# Patient Record
Sex: Male | Born: 1955 | Race: White | Hispanic: No | Marital: Married | Smoking: Never smoker
Health system: Southern US, Community
[De-identification: ages and names within clinical notes are randomized; demographics above are authoritative.]

## PROBLEM LIST (undated history)

## (undated) DIAGNOSIS — G510 Bell's palsy: Secondary | ICD-10-CM

## (undated) DIAGNOSIS — G4733 Obstructive sleep apnea (adult) (pediatric): Secondary | ICD-10-CM

## (undated) DIAGNOSIS — G4710 Hypersomnia, unspecified: Secondary | ICD-10-CM

## (undated) DIAGNOSIS — E119 Type 2 diabetes mellitus without complications: Secondary | ICD-10-CM

## (undated) DIAGNOSIS — E7800 Pure hypercholesterolemia, unspecified: Secondary | ICD-10-CM

## (undated) HISTORY — DX: Bell's palsy: G51.0

## (undated) HISTORY — DX: Pure hypercholesterolemia, unspecified: E78.00

## (undated) HISTORY — DX: Type 2 diabetes mellitus without complications: E11.9

## (undated) HISTORY — DX: Obstructive sleep apnea (adult) (pediatric): G47.33

## (undated) HISTORY — DX: Hypersomnia, unspecified: G47.10

---

## 2000-02-08 ENCOUNTER — Ambulatory Visit: Admission: RE | Admit: 2000-02-08 | Discharge: 2000-02-08 | Payer: Self-pay | Admitting: Otolaryngology

## 2008-03-09 ENCOUNTER — Encounter: Admission: RE | Admit: 2008-03-09 | Discharge: 2008-03-09 | Payer: Self-pay

## 2008-08-20 ENCOUNTER — Ambulatory Visit: Payer: Self-pay | Admitting: Oncology

## 2008-09-03 LAB — COMPREHENSIVE METABOLIC PANEL WITH GFR
ALT: 67 U/L — ABNORMAL HIGH (ref 0–53)
AST: 52 U/L — ABNORMAL HIGH (ref 0–37)
Albumin: 4.1 g/dL (ref 3.5–5.2)
Alkaline Phosphatase: 68 U/L (ref 39–117)
BUN: 12 mg/dL (ref 6–23)
CO2: 27 meq/L (ref 19–32)
Calcium: 9 mg/dL (ref 8.4–10.5)
Chloride: 103 meq/L (ref 96–112)
Creatinine, Ser: 0.85 mg/dL (ref 0.40–1.50)
Glucose, Bld: 149 mg/dL — ABNORMAL HIGH (ref 70–99)
Potassium: 4.2 meq/L (ref 3.5–5.3)
Sodium: 138 meq/L (ref 135–145)
Total Bilirubin: 0.6 mg/dL (ref 0.3–1.2)
Total Protein: 7.3 g/dL (ref 6.0–8.3)

## 2008-09-03 LAB — PROTHROMBIN TIME
INR: 1 (ref 0.0–1.5)
Prothrombin Time: 13.5 s (ref 11.6–15.2)

## 2008-09-03 LAB — CBC WITH DIFFERENTIAL/PLATELET
BASO%: 0.2 % (ref 0.0–2.0)
EOS%: 4.2 % (ref 0.0–7.0)
HCT: 38 % — ABNORMAL LOW (ref 38.7–49.9)
MCH: 29.8 pg (ref 28.0–33.4)
MCHC: 35.3 g/dL (ref 32.0–35.9)
MONO#: 0.5 10*3/uL (ref 0.1–0.9)
NEUT%: 62.7 % (ref 40.0–75.0)
RBC: 4.51 10*6/uL (ref 4.20–5.71)
WBC: 5.9 10*3/uL (ref 4.0–10.0)
lymph#: 1.4 10*3/uL (ref 0.9–3.3)

## 2008-09-03 LAB — LACTATE DEHYDROGENASE: LDH: 138 U/L (ref 94–250)

## 2008-09-03 LAB — CHCC SMEAR

## 2008-09-20 ENCOUNTER — Encounter: Admission: RE | Admit: 2008-09-20 | Discharge: 2008-12-19 | Payer: Self-pay | Admitting: Family Medicine

## 2008-12-31 ENCOUNTER — Ambulatory Visit: Payer: Self-pay | Admitting: Oncology

## 2009-02-02 LAB — CBC WITH DIFFERENTIAL/PLATELET
BASO%: 0.6 % (ref 0.0–2.0)
EOS%: 4.4 % (ref 0.0–7.0)
LYMPH%: 24.9 % (ref 14.0–49.0)
MCH: 29.4 pg (ref 27.2–33.4)
MCHC: 35.1 g/dL (ref 32.0–36.0)
MCV: 83.6 fL (ref 79.3–98.0)
MONO%: 8.5 % (ref 0.0–14.0)
NEUT#: 3.3 10*3/uL (ref 1.5–6.5)
Platelets: 110 10*3/uL — ABNORMAL LOW (ref 140–400)
RBC: 4.55 10*6/uL (ref 4.20–5.82)
RDW: 13.4 % (ref 11.0–14.6)

## 2009-02-02 LAB — CHCC SMEAR

## 2009-08-05 ENCOUNTER — Ambulatory Visit: Payer: Self-pay | Admitting: Oncology

## 2014-10-27 ENCOUNTER — Telehealth: Payer: Self-pay | Admitting: Pulmonary Disease

## 2014-10-27 NOTE — Telephone Encounter (Signed)
Called and spoke to pt. Pt has upcoming appt with [facility] on 12/07/14. Pt had questions regarding the payment of the appt, pt does not have insurance coverage. Spoke with Vallarie Mare and was advised a level 2 or 3 appt would cost around $500 with 50% off d/t no insurance. Informed pt. Pt verbalized understanding and denied any further questions or concerns at this time.

## 2014-11-15 ENCOUNTER — Institutional Professional Consult (permissible substitution): Payer: Self-pay | Admitting: Pulmonary Disease

## 2014-12-06 ENCOUNTER — Encounter: Payer: Self-pay | Admitting: Pulmonary Disease

## 2014-12-06 ENCOUNTER — Other Ambulatory Visit: Payer: Self-pay | Admitting: Pulmonary Disease

## 2014-12-07 ENCOUNTER — Institutional Professional Consult (permissible substitution): Payer: Self-pay | Admitting: Pulmonary Disease

## 2018-08-26 ENCOUNTER — Encounter: Payer: Self-pay | Admitting: Hematology and Oncology

## 2018-08-26 ENCOUNTER — Telehealth: Payer: Self-pay | Admitting: Hematology and Oncology

## 2018-08-26 NOTE — Telephone Encounter (Signed)
New referral received from Dr. Daron Offer for pancytopenia. Pt has been scheduled to see Dr. Audelia Hives on 10/18 at [place] from the referring office will notify the pt.

## 2018-09-05 ENCOUNTER — Encounter: Payer: Self-pay | Admitting: Hematology and Oncology

## 2018-09-08 NOTE — Progress Notes (Deleted)
[facility] Initial Consultation  Patient Name:  Carlos Hamilton  DOB: [birth year]   Date of Service: September 10, 2018  Referring Provider: Nickola Major, Md [address] [place], [place] [address]   Consulting Physician: Henreitta Leber, MD Hematology/Oncology  Patient Care Team: No care team member to display  No current outpatient medications on file prior to visit.   No current facility-administered medications on file prior to visit.      Reason for Referral:   Cancer History:  Cancer Staging:  History Present Illness: Carlos Hamilton   Past Medical History: Past Medical History:  Diagnosis Date  . Bell's palsy   . Diabetes   . Hypercholesteremia   . Hypersomnia   . OSA (obstructive sleep apnea)     Surgical History: *** The histories are not reviewed yet. Please review them in the "History" navigator section and refresh this [place].  Gynecologic History:   Family History: Family History  Problem Relation Age of Onset  . Cancer Unknown   . Asthma Unknown   . Heart disease Unknown     Social History: Social History   Socioeconomic History  . Marital status: Married    Spouse name: Not on file  . Number of children: Not on file  . Years of education: Not on file  . Highest education level: Not on file  Occupational History  . Not on file  Social Needs  . Financial resource strain: Not on file  . Food insecurity:    Worry: Not on file    Inability: Not on file  . Transportation needs:    Medical: Not on file    Non-medical: Not on file  Tobacco Use  . Smoking status: Not on file  Substance and Sexual Activity  . Alcohol use: Not on file  . Drug use: Not on file  . Sexual activity: Not on file  Lifestyle  . Physical activity:    Days per week: Not on file    Minutes per session: Not on file  . Stress: Not on file  Relationships  . Social connections:    Talks on phone: Not on file     Gets together: Not on file    Attends religious service: Not on file    Active member of club or organization: Not on file    Attends meetings of clubs or organizations: Not on file    Relationship status: Not on file  . Intimate partner violence:    Fear of current or ex partner: Not on file    Emotionally abused: Not on file    Physically abused: Not on file    Forced sexual activity: Not on file  Other Topics Concern  . Not on file  Social History Narrative  . Not on file    Transfusion History: No prior transfusion  Exposure History:   Allergies:  Allergies not on file  Current Medications: No current outpatient medications on file prior to visit.   No current facility-administered medications on file prior to visit.      Review of Systems: Constitutional: No fever, sweats, or shaking chills.  No appetite or weight deficit. Skin: No rash, scaling, sores, lumps, or jaundice. HEENT: No visual changes or hearing deficit. Pulmonary: No unusual cough, sore throat, or orthopnea. Breasts: No complaints. Cardiovascular: No coronary artery disease, angina, or myocardial infarction.  No cardiac dysrhythmia, essential hypertension, or dyslipidemia. Gastrointestinal: No indigestion, dysphagia, abdominal pain, diarrhea, or constipation.  No change in bowel habits. Genitourinary: No frequency, urgency, hematuria, or dysuria. Musculoskeletal: No arthralgias or myalgias; no joint swelling, pain, or instability. Hematologic: No bleeding tendency or easy bruisability. Endocrine: No intolerance to heat or cold; no thyroid disease or diabetes mellitus. Vascular: No peripheral arterial or venous thromboembolic disease. Psychological: No anxiety, depression, or mood changes; no mental health illnesses. Neurological: No dizziness, lightheadedness, syncope, or near syncopal episodes; no numbness or tingling in the fingers or toes.  Physical Examination: Vital Signs: There is no height  or weight on file to calculate BSA. There were no vitals filed for this visit. There were no vitals filed for this visit. ECOG PERFORMANCE STATUS: {CHL ONC ECOG [id number]} Constitutional:  Carlos Hamilton is fully nourished and developed.  He/She looks age appropriate.  He/She is friendly and cooperative without respiratory compromise at rest. Skin: No rashes, scaling, dryness, jaundice, or itching. HEENT: Head is normocephalic and atraumatic.  Pupils are equal round and reactive to light and accommodation.  Sclerae are anicteric.  Conjunctivae are pink.  No sinus tenderness nor oropharyngeal lesions.  Lips without cracking or peeling; tongue without mass, inflammation, or nodularity.  Mucous membranes are moist. Neck: Supple and symmetric.  No jugular venous distention or thyromegaly.  Trachea is midline. Lymphatics: No cervical or supraclavicular lymphadenopathy.  No epitrochlear, axillary, or inguinal lymphadenopathy is appreciated. Breasts: No mass, discharge, dimpling, or retraction. Respiratory/chest: Thorax is symmetrical.  Breath sounds are clear to auscultation and percussion.  Normal excursion and respiratory effort. Back: Symmetric without deformity or tenderness. Cardiovascular: Heart rate and rhythm are regular without murmurs, gallops, or rubs. Gastrointestinal: Abdomen is soft, nontender; no organomegaly.  Bowel sounds are normoactive.  No masses are appreciated. Genitourinary: Normal external male/male genitalia. Rectal examination: Not performed. Extremities: In the lower extremities, there is no asymmetric swelling, erythema, tenderness, or cord formation.  No clubbing, cyanosis, nor edema. Hematologic: No petechiae, hematomas, or ecchymoses. Psychological:  He/She is oriented to person, place, and time; normal affect, memory, and cognition. Neurological: There are no gross neurologic deficits.  Laboratory Results: I have reviewed the data as listed: CBC Latest Ref Rng & Units  02/02/2009 09/03/2008  WBC 4.0 - 10.3 10e3/uL 5.4 5.9  Hemoglobin 13.0 - 17.1 g/dL 13.4 13.4  Hematocrit 38.4 - 49.9 % 38.0(L) 38.0(L)  Platelets 140 - 400 10e3/uL 110(L) 122(L)    CMP Latest Ref Rng & Units 09/03/2008  Glucose 70 - 99 mg/dL 149(H)  BUN 6 - 23 mg/dL 12  Creatinine 0.40 - 1.50 mg/dL 0.85  Sodium 135 - 145 mEq/L 138  Potassium 3.5 - 5.3 mEq/L 4.2  Chloride 96 - 112 mEq/L 103  CO2 19 - 32 mEq/L 27  Calcium 8.4 - 10.5 mg/dL 9.0  Total Protein 6.0 - 8.3 g/dL 7.3  Total Bilirubin 0.3 - 1.2 mg/dL 0.6  Alkaline Phos 39 - 117 U/L 68  AST 0 - 37 U/L 52(H)  ALT 0 - 53 U/L 67(H)     Diagnostic/Imaging Studies:   Summary/Assessment:   Recommendation/Plan:   The total time spent discussing the XXX, methodology for evaluating XXX,  preliminary considerations with recommendations and plan was 60 minutes.  At least 50% of that time was spent in discussion, reviewing outside records, laboratory evaluation, counseling, and answering questions. All questions were answered to his satisfaction. he knows to call the Clinic with any problems, questions, or concerns.  This note was dictated using voice activated technology/software.  Unfortunately, typographical errors are not uncommon, and transcription is subject  to mistakes and regrettably misinterpretation.  If necessary, clarification of the above information can be discussed with me at any time.  Thank you Dr. Lannette Donath for allowing my participation in the care of [facility]. I will keep you closely informed as the results of his preliminary laboratory data become available.  Please do not hesitate to call should any questions arise regarding this initial consultation and discussion.  FOLLOW UP: AS DIRECTED   cc:   Henreitta Leber, MD  Hematology/Oncology [address] [place] [place], [place] [address] Office: [phone] GBMS: [phone]

## 2018-09-09 ENCOUNTER — Encounter: Payer: Self-pay | Admitting: Hematology and Oncology

## 2018-09-09 ENCOUNTER — Telehealth: Payer: Self-pay | Admitting: Hematology and Oncology

## 2018-09-09 NOTE — Telephone Encounter (Signed)
Lft the pt a vm to cb to reschedule appt with Dr. Audelia Hives

## 2018-09-11 ENCOUNTER — Encounter: Payer: Self-pay | Admitting: Hematology and Oncology

## 2018-09-11 ENCOUNTER — Telehealth: Payer: Self-pay | Admitting: Hematology and Oncology

## 2018-09-11 NOTE — Telephone Encounter (Signed)
Pt cld to reschedule appt with Dr. Audelia Hives. A new hem appt has been rescheduled for the pt to see Dr. Audelia Hives on 11/5 at [place] letter mailed to the pt.

## 2018-09-16 ENCOUNTER — Telehealth: Payer: Self-pay | Admitting: Hematology and Oncology

## 2018-09-16 ENCOUNTER — Telehealth: Payer: Self-pay | Admitting: *Deleted

## 2018-09-16 NOTE — Telephone Encounter (Signed)
Left message for patient per 10/29 sch message - lab only appt.

## 2018-09-16 NOTE — Telephone Encounter (Signed)
Received TC from patient. He will be a new pt for Dr. Audelia Hives on 09/23/18 @ 1pm. He had some questions about some words he saw on his appt schedule and requested the meaning. The main word was 'pancytopenia'. Discussed the meaning of this terminology. Pt voiced understanding.  Informed pt that additional lab work would be needed to help determine cause of pancytopenia. Noted lab orders are already in Epic but no lab appt.  High priority scheduling message sent for lab appt tomorrow so hopefully most results will be available by the time his appt with Dr. Audelia Hives occurs next week.  Pt aware of this and understands he will get a call from scheduling today. 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 He preferred late afternoon appt for labs tomorrow.

## 2018-09-17 ENCOUNTER — Inpatient Hospital Stay: Payer: Self-pay | Attending: Hematology and Oncology

## 2018-09-17 ENCOUNTER — Other Ambulatory Visit: Payer: Self-pay

## 2018-09-23 ENCOUNTER — Inpatient Hospital Stay: Payer: Self-pay | Admitting: Hematology and Oncology

## 2018-09-23 ENCOUNTER — Telehealth: Payer: Self-pay

## 2018-09-23 NOTE — Telephone Encounter (Signed)
I called and spoke with this patient regarding his missed appointment.  He stated that he called yesterday to cancel his appointment.  He will call us back when he is ready to reschedule.

## 2019-12-01 ENCOUNTER — Other Ambulatory Visit: Payer: Self-pay

## 2019-12-01 ENCOUNTER — Emergency Department (HOSPITAL_COMMUNITY): Payer: BLUE CROSS/BLUE SHIELD

## 2019-12-01 ENCOUNTER — Inpatient Hospital Stay (HOSPITAL_COMMUNITY)
Admission: EM | Admit: 2019-12-01 | Discharge: 2019-12-09 | DRG: 314 | Disposition: A | Discharge status: Home Health | Payer: BLUE CROSS/BLUE SHIELD | Attending: Student in an Organized Health Care Education/Training Program | Admitting: Student in an Organized Health Care Education/Training Program

## 2019-12-01 ENCOUNTER — Encounter (HOSPITAL_COMMUNITY): Payer: Self-pay | Admitting: Emergency Medicine

## 2019-12-01 ENCOUNTER — Observation Stay (HOSPITAL_COMMUNITY): Payer: BLUE CROSS/BLUE SHIELD

## 2019-12-01 DIAGNOSIS — Z89431 Acquired absence of right foot: Secondary | ICD-10-CM

## 2019-12-01 DIAGNOSIS — L02619 Cutaneous abscess of unspecified foot: Secondary | ICD-10-CM

## 2019-12-01 DIAGNOSIS — E1129 Type 2 diabetes mellitus with other diabetic kidney complication: Secondary | ICD-10-CM

## 2019-12-01 DIAGNOSIS — G510 Bell's palsy: Secondary | ICD-10-CM | POA: Diagnosis present

## 2019-12-01 DIAGNOSIS — Z8614 Personal history of Methicillin resistant Staphylococcus aureus infection: Secondary | ICD-10-CM

## 2019-12-01 DIAGNOSIS — K7030 Alcoholic cirrhosis of liver without ascites: Secondary | ICD-10-CM

## 2019-12-01 DIAGNOSIS — R7881 Bacteremia: Secondary | ICD-10-CM

## 2019-12-01 DIAGNOSIS — B9562 Methicillin resistant Staphylococcus aureus infection as the cause of diseases classified elsewhere: Secondary | ICD-10-CM | POA: Diagnosis present

## 2019-12-01 DIAGNOSIS — IMO0002 Reserved for concepts with insufficient information to code with codable children: Secondary | ICD-10-CM

## 2019-12-01 DIAGNOSIS — Z79899 Other long term (current) drug therapy: Secondary | ICD-10-CM

## 2019-12-01 DIAGNOSIS — K767 Hepatorenal syndrome: Secondary | ICD-10-CM | POA: Diagnosis present

## 2019-12-01 DIAGNOSIS — M14671 Charcot's joint, right ankle and foot: Secondary | ICD-10-CM | POA: Diagnosis present

## 2019-12-01 DIAGNOSIS — I878 Other specified disorders of veins: Secondary | ICD-10-CM | POA: Diagnosis present

## 2019-12-01 DIAGNOSIS — G4730 Sleep apnea, unspecified: Secondary | ICD-10-CM | POA: Diagnosis present

## 2019-12-01 DIAGNOSIS — N186 End stage renal disease: Secondary | ICD-10-CM

## 2019-12-01 DIAGNOSIS — E1142 Type 2 diabetes mellitus with diabetic polyneuropathy: Secondary | ICD-10-CM

## 2019-12-01 DIAGNOSIS — A4102 Sepsis due to Methicillin resistant Staphylococcus aureus: Secondary | ICD-10-CM

## 2019-12-01 DIAGNOSIS — T827XXA Infection and inflammatory reaction due to other cardiac and vascular devices, implants and grafts, initial encounter: Principal | ICD-10-CM | POA: Diagnosis present

## 2019-12-01 DIAGNOSIS — Z0189 Encounter for other specified special examinations: Secondary | ICD-10-CM

## 2019-12-01 DIAGNOSIS — D696 Thrombocytopenia, unspecified: Secondary | ICD-10-CM

## 2019-12-01 DIAGNOSIS — D631 Anemia in chronic kidney disease: Secondary | ICD-10-CM

## 2019-12-01 DIAGNOSIS — E43 Unspecified severe protein-calorie malnutrition: Secondary | ICD-10-CM | POA: Diagnosis present

## 2019-12-01 DIAGNOSIS — Z66 Do not resuscitate: Secondary | ICD-10-CM | POA: Diagnosis present

## 2019-12-01 DIAGNOSIS — D61818 Other pancytopenia: Secondary | ICD-10-CM | POA: Diagnosis present

## 2019-12-01 DIAGNOSIS — K7460 Unspecified cirrhosis of liver: Secondary | ICD-10-CM | POA: Diagnosis present

## 2019-12-01 DIAGNOSIS — E1122 Type 2 diabetes mellitus with diabetic chronic kidney disease: Secondary | ICD-10-CM | POA: Diagnosis present

## 2019-12-01 DIAGNOSIS — F1010 Alcohol abuse, uncomplicated: Secondary | ICD-10-CM | POA: Diagnosis not present

## 2019-12-01 DIAGNOSIS — K7290 Hepatic failure, unspecified without coma: Secondary | ICD-10-CM | POA: Diagnosis present

## 2019-12-01 DIAGNOSIS — N2581 Secondary hyperparathyroidism of renal origin: Secondary | ICD-10-CM | POA: Diagnosis present

## 2019-12-01 DIAGNOSIS — E872 Acidosis: Secondary | ICD-10-CM | POA: Diagnosis present

## 2019-12-01 DIAGNOSIS — Z6829 Body mass index (BMI) 29.0-29.9, adult: Secondary | ICD-10-CM

## 2019-12-01 DIAGNOSIS — Z89421 Acquired absence of other right toe(s): Secondary | ICD-10-CM

## 2019-12-01 DIAGNOSIS — I358 Other nonrheumatic aortic valve disorders: Secondary | ICD-10-CM | POA: Diagnosis present

## 2019-12-01 DIAGNOSIS — I120 Hypertensive chronic kidney disease with stage 5 chronic kidney disease or end stage renal disease: Secondary | ICD-10-CM | POA: Diagnosis present

## 2019-12-01 DIAGNOSIS — Z20822 Contact with and (suspected) exposure to covid-19: Secondary | ICD-10-CM | POA: Diagnosis present

## 2019-12-01 DIAGNOSIS — E11610 Type 2 diabetes mellitus with diabetic neuropathic arthropathy: Secondary | ICD-10-CM

## 2019-12-01 DIAGNOSIS — Z1389 Encounter for screening for other disorder: Secondary | ICD-10-CM

## 2019-12-01 DIAGNOSIS — E1140 Type 2 diabetes mellitus with diabetic neuropathy, unspecified: Secondary | ICD-10-CM | POA: Diagnosis present

## 2019-12-01 DIAGNOSIS — R531 Weakness: Secondary | ICD-10-CM

## 2019-12-01 DIAGNOSIS — A419 Sepsis, unspecified organism: Secondary | ICD-10-CM | POA: Diagnosis present

## 2019-12-01 DIAGNOSIS — Z992 Dependence on renal dialysis: Secondary | ICD-10-CM

## 2019-12-01 DIAGNOSIS — Y831 Surgical operation with implant of artificial internal device as the cause of abnormal reaction of the patient, or of later complication, without mention of misadventure at the time of the procedure: Secondary | ICD-10-CM | POA: Diagnosis present

## 2019-12-01 LAB — DIFFERENTIAL
Abs Immature Granulocytes: 0.1 10*3/uL — ABNORMAL HIGH (ref 0.00–0.07)
Basophils Absolute: 0 10*3/uL (ref 0.0–0.1)
Basophils Relative: 0 %
Eosinophils Absolute: 0 10*3/uL (ref 0.0–0.5)
Eosinophils Relative: 0 %
Immature Granulocytes: 2 %
Lymphocytes Relative: 5 %
Lymphs Abs: 0.3 10*3/uL — ABNORMAL LOW (ref 0.7–4.0)
Monocytes Absolute: 0.8 10*3/uL (ref 0.1–1.0)
Monocytes Relative: 14 %
Neutro Abs: 4.5 10*3/uL (ref 1.7–7.7)
Neutrophils Relative %: 79 %

## 2019-12-01 LAB — PROTIME-INR
INR: 1.2 (ref 0.8–1.2)
Prothrombin Time: 15.1 seconds (ref 11.4–15.2)

## 2019-12-01 LAB — CBC
HCT: 31.9 % — ABNORMAL LOW (ref 39.0–52.0)
Hemoglobin: 9.8 g/dL — ABNORMAL LOW (ref 13.0–17.0)
MCH: 25.4 pg — ABNORMAL LOW (ref 26.0–34.0)
MCHC: 30.7 g/dL (ref 30.0–36.0)
MCV: 82.6 fL (ref 80.0–100.0)
Platelets: 86 10*3/uL — ABNORMAL LOW (ref 150–400)
RBC: 3.86 MIL/uL — ABNORMAL LOW (ref 4.22–5.81)
RDW: 16 % — ABNORMAL HIGH (ref 11.5–15.5)
WBC: 5.7 10*3/uL (ref 4.0–10.5)
nRBC: 0 % (ref 0.0–0.2)

## 2019-12-01 LAB — COMPREHENSIVE METABOLIC PANEL
ALT: 78 U/L — ABNORMAL HIGH (ref 0–44)
AST: 179 U/L — ABNORMAL HIGH (ref 15–41)
Albumin: 2.2 g/dL — ABNORMAL LOW (ref 3.5–5.0)
Alkaline Phosphatase: 285 U/L — ABNORMAL HIGH (ref 38–126)
Anion gap: 17 — ABNORMAL HIGH (ref 5–15)
BUN: 47 mg/dL — ABNORMAL HIGH (ref 8–23)
CO2: 20 mmol/L — ABNORMAL LOW (ref 22–32)
Calcium: 8.2 mg/dL — ABNORMAL LOW (ref 8.9–10.3)
Chloride: 93 mmol/L — ABNORMAL LOW (ref 98–111)
Creatinine, Ser: 6.17 mg/dL — ABNORMAL HIGH (ref 0.61–1.24)
GFR calc Af Amer: 10 mL/min — ABNORMAL LOW (ref >60)
GFR calc non Af Amer: 9 mL/min — ABNORMAL LOW (ref >60)
Glucose, Bld: 118 mg/dL — ABNORMAL HIGH (ref 70–99)
Potassium: 4.7 mmol/L (ref 3.5–5.1)
Sodium: 130 mmol/L — ABNORMAL LOW (ref 135–145)
Total Bilirubin: 1.2 mg/dL (ref 0.3–1.2)
Total Protein: 8.3 g/dL — ABNORMAL HIGH (ref 6.5–8.1)

## 2019-12-01 LAB — I-STAT CHEM 8, ED
BUN: 42 mg/dL — ABNORMAL HIGH (ref 8–23)
Calcium, Ion: 0.83 mmol/L — CL (ref 1.15–1.40)
Chloride: 99 mmol/L (ref 98–111)
Creatinine, Ser: 6.4 mg/dL — ABNORMAL HIGH (ref 0.61–1.24)
Glucose, Bld: 116 mg/dL — ABNORMAL HIGH (ref 70–99)
HCT: 31 % — ABNORMAL LOW (ref 39.0–52.0)
Hemoglobin: 10.5 g/dL — ABNORMAL LOW (ref 13.0–17.0)
Potassium: 4.7 mmol/L (ref 3.5–5.1)
Sodium: 132 mmol/L — ABNORMAL LOW (ref 135–145)
TCO2: 23 mmol/L (ref 22–32)

## 2019-12-01 LAB — RESPIRATORY PANEL BY RT PCR (FLU A&B, COVID)
Influenza A by PCR: NEGATIVE
Influenza B by PCR: NEGATIVE
SARS Coronavirus 2 by RT PCR: NEGATIVE

## 2019-12-01 LAB — CBG MONITORING, ED: Glucose-Capillary: 114 mg/dL — ABNORMAL HIGH (ref 70–99)

## 2019-12-01 LAB — APTT: aPTT: 35 seconds (ref 24–36)

## 2019-12-01 LAB — LACTIC ACID, PLASMA: Lactic Acid, Venous: 2.7 mmol/L (ref 0.5–1.9)

## 2019-12-01 MED ORDER — METRONIDAZOLE IN NACL 5-0.79 MG/ML-% IV SOLN
500.0000 mg | Freq: Once | INTRAVENOUS | Status: AC
  Administered 2019-12-01: 19:00:00 500 mg via INTRAVENOUS
  Filled 2019-12-01: qty 100

## 2019-12-01 MED ORDER — SODIUM CHLORIDE 0.9% FLUSH
3.0000 mL | Freq: Once | INTRAVENOUS | Status: AC
Start: 2019-12-01 — End: 2019-12-01
  Administered 2019-12-01: 3 mL via INTRAVENOUS

## 2019-12-01 MED ORDER — SODIUM CHLORIDE 0.9 % IV SOLN
2.0000 g | Freq: Once | INTRAVENOUS | Status: AC
  Administered 2019-12-01: 19:00:00 2 g via INTRAVENOUS
  Filled 2019-12-01: qty 2

## 2019-12-01 MED ORDER — SODIUM CHLORIDE 0.9 % IV SOLN
1.0000 g | INTRAVENOUS | Status: DC
  Filled 2019-12-01: qty 1

## 2019-12-01 MED ORDER — VANCOMYCIN HCL 2000 MG/400ML IV SOLN
2000.0000 mg | Freq: Once | INTRAVENOUS | Status: AC
  Administered 2019-12-01: 21:00:00 2000 mg via INTRAVENOUS
  Filled 2019-12-01: qty 400

## 2019-12-01 MED ORDER — HEPARIN SODIUM (PORCINE) 5000 UNIT/ML IJ SOLN
5000.0000 [IU] | Freq: Three times a day (TID) | INTRAMUSCULAR | Status: DC
  Administered 2019-12-02 – 2019-12-07 (×15): 5000 [IU] via SUBCUTANEOUS
  Filled 2019-12-01 (×11): qty 1

## 2019-12-01 MED ORDER — SODIUM CHLORIDE 0.9 % IV BOLUS
1000.0000 mL | Freq: Once | INTRAVENOUS | Status: AC
  Administered 2019-12-01: 19:00:00 1000 mL via INTRAVENOUS

## 2019-12-01 MED ORDER — VANCOMYCIN HCL IN DEXTROSE 750-5 MG/150ML-% IV SOLN
750.0000 mg | INTRAVENOUS | Status: DC
  Administered 2019-12-03: 750 mg via INTRAVENOUS
  Filled 2019-12-01 (×3): qty 150

## 2019-12-01 MED ORDER — VANCOMYCIN HCL IN DEXTROSE 1-5 GM/200ML-% IV SOLN: 1000.0000 mg | Freq: Once | INTRAVENOUS | Status: DC

## 2019-12-01 MED ORDER — ACETAMINOPHEN 325 MG PO TABS
650.0000 mg | ORAL_TABLET | Freq: Once | ORAL | Status: AC
  Administered 2019-12-01: 650 mg via ORAL
  Filled 2019-12-01: qty 2

## 2019-12-01 MED ORDER — ACETAMINOPHEN 500 MG PO TABS
1000.0000 mg | ORAL_TABLET | Freq: Once | ORAL | Status: AC
  Administered 2019-12-01: 1000 mg via ORAL
  Filled 2019-12-01: qty 2

## 2019-12-01 MED ORDER — SODIUM CHLORIDE 0.9% FLUSH
3.0000 mL | Freq: Two times a day (BID) | INTRAVENOUS | Status: DC
  Administered 2019-12-02 – 2019-12-09 (×12): 3 mL via INTRAVENOUS

## 2019-12-01 NOTE — Progress Notes (Signed)
Pharmacy Antibiotic Note  Carlos Hamilton is a 64 y.o. male admitted on 12/01/2019 with sepsis.  Pharmacy has been consulted for Cefepime and Vancomycin dosing.      Temp (24hrs), Avg:102.5 F (39.2 C), Min:101.9 F (38.8 C), Max:103.1 F (39.5 C)  Recent Labs  Lab 12/01/19 1714 12/01/19 1733  WBC 5.7  --   CREATININE 6.17* 6.40*    CrCl cannot be calculated (Unknown ideal weight.).    No Known Allergies  Antimicrobials this admission: 1/12 Cefepime >>  1/12 Vancomycin >>   Dose adjustments this admission:   Microbiology results: 1/12 BCx: Pending 1/12 UCx: Pending    Plan:  - Cefepime 2g IV x 1 dose followed by Cefepime 1g IV q24hr - Vancomycin 2000 mg IV x 1 dose  - Vancomycin 750mg  IV q TTS with HD  - Monitor cultures and HD schedule   Thank you for allowing pharmacy to be a part of this patient's care.  Duanne Limerick PharmD. BCPS  12/01/2019 6:26 PM

## 2019-12-01 NOTE — ED Provider Notes (Signed)
[facility] EMERGENCY DEPARTMENT Provider Note   CSN: [id number] Arrival date & time: 12/01/19  1639     History Chief Complaint  Patient presents with  . Altered Mental Status  . Fatigue    Carlos Hamilton is a 64 y.o. male.  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 According to brother at the bedside who provided most of the history he reports patient has had a decrease in mental status since Saturday.  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  He also reports he looked unwell prior to starting dialysis this morning around 10 AM, he reports he completed his dialysis treatment when he picked him up around 3:00 he reports patient appeared more lethargic, felt warm to the touch.  Patient is able to voice no pain.  Of note, brother does report patient has a new cough, suspect this is likely due to his CPAP machine.  Unable to obtain further history from patient.  Level 5 caveat.       The history is provided by the patient.  Altered Mental Status Associated symptoms: fever and weakness   Associated symptoms: no abdominal pain, no nausea and no vomiting        Past Medical History:  Diagnosis Date  . Bell's palsy   . Diabetes ([place])   . Hypercholesteremia   . Hypersomnia   . OSA (obstructive sleep apnea)     There are no problems to display for this patient.   History reviewed. No pertinent surgical history.     Family History  Problem Relation Age of Onset  . Cancer Other   . Asthma Other   . Heart disease Other     Social History   Tobacco Use  . Smoking status: Never Smoker  . Smokeless tobacco: Never Used  Substance Use Topics  . Alcohol use: Not Currently  . Drug use: Not Currently    Home Medications Prior to Admission medications   Not on  File    Allergies    Patient has no known allergies.  Review of Systems   Review of Systems  Constitutional: Positive for fever.  HENT: Negative for sinus pressure and sore throat.   Respiratory: Negative for shortness of breath.   Cardiovascular: Negative for chest pain.  Gastrointestinal: Negative for abdominal pain, nausea and vomiting.  Genitourinary: Negative for flank pain.  Musculoskeletal: Negative for back pain.  Skin: Positive for color change. Negative for pallor.  Neurological: Positive for weakness. Negative for syncope and speech difficulty.    Physical Exam Updated Vital Signs BP 110/69 (BP Location: Right Arm)   Pulse 95   Temp (!) 101.8 F (38.8 C) (Oral)   Resp (!) 21   SpO2 95%   Physical Exam Vitals and nursing note reviewed.  Constitutional:      Appearance: He is ill-appearing and diaphoretic.  HENT:     Head: Normocephalic and atraumatic.     Mouth/Throat:     Mouth: Mucous membranes are dry.  Eyes:     Pupils: Pupils are equal, round, and reactive to light.  Cardiovascular:     Rate and Rhythm: Tachycardia present.     Pulses:          Dorsalis pedis pulses are 1+ on the right  side and 2+ on the left side.       Posterior tibial pulses are 1+ on the right side and 2+ on the left side.  Pulmonary:     Effort: Pulmonary effort is normal.     Breath sounds: Normal breath sounds. No wheezing, rhonchi or rales.  Abdominal:     General: Abdomen is flat. Bowel sounds are decreased.     Palpations: Abdomen is soft.     Tenderness: There is no abdominal tenderness. There is no right CVA tenderness or left CVA tenderness.     Hernia: A hernia is present. Hernia is present in the umbilical area.  Musculoskeletal:     Cervical back: Normal range of motion and neck supple.     Right lower leg: 1+ Edema present.     Left lower leg: 1+ Edema present.     Right foot: Charcot foot present.  Feet:     Right foot:     Skin integrity: Erythema and dry  skin present.     Toenail Condition: Right toenails are abnormally thick.     Left foot:     Skin integrity: Erythema and dry skin present.  Neurological:     Mental Status: He is alert. He is disoriented.     ED Results / Procedures / Treatments   Labs (all labs ordered are listed, but only abnormal results are displayed) Labs Reviewed  CBC - Abnormal; Notable for the following components:      Result Value   RBC 3.86 (*)    Hemoglobin 9.8 (*)    HCT 31.9 (*)    MCH 25.4 (*)    RDW 16.0 (*)    Platelets 86 (*)    All other components within normal limits  DIFFERENTIAL - Abnormal; Notable for the following components:   Lymphs Abs 0.3 (*)    Abs Immature Granulocytes 0.10 (*)    All other components within normal limits  COMPREHENSIVE METABOLIC PANEL - Abnormal; Notable for the following components:   Sodium 130 (*)    Chloride 93 (*)    CO2 20 (*)    Glucose, Bld 118 (*)    BUN 47 (*)    Creatinine, Ser 6.17 (*)    Calcium 8.2 (*)    Total Protein 8.3 (*)    Albumin 2.2 (*)    AST 179 (*)    ALT 78 (*)    Alkaline Phosphatase 285 (*)    GFR calc non Af Amer 9 (*)    GFR calc Af Amer 10 (*)    Anion gap 17 (*)    All other components within normal limits  LACTIC ACID, PLASMA - Abnormal; Notable for the following components:   Lactic Acid, Venous 2.7 (*)    All other components within normal limits  I-STAT CHEM 8, ED - Abnormal; Notable for the following components:   Sodium 132 (*)    BUN 42 (*)    Creatinine, Ser 6.40 (*)    Glucose, Bld 116 (*)    Calcium, Ion 0.83 (*)    Hemoglobin 10.5 (*)    HCT 31.0 (*)    All other components within normal limits  CBG MONITORING, ED - Abnormal; Notable for the following components:   Glucose-Capillary 114 (*)    All other components within normal limits  CULTURE, BLOOD (SINGLE)  URINE CULTURE  CULTURE, BLOOD (ROUTINE X 2)  CULTURE, BLOOD (ROUTINE X 2)  RESPIRATORY PANEL BY RT PCR (FLU A&B,  COVID)  PROTIME-INR    APTT  LACTIC ACID, PLASMA  URINALYSIS, ROUTINE W REFLEX MICROSCOPIC  POC SARS CORONAVIRUS 2 AG -  ED    EKG EKG Interpretation  Date/Time:  Tuesday December 01 2019 17:56:05 EST Ventricular Rate:  109 PR Interval:  188 QRS Duration: 79 QT Interval:  322 QTC Calculation: 434 R Axis:   71 Text Interpretation: Sinus tachycardia Atrial premature complex No significant change since last tracing Confirmed by Wandra Arthurs [phone]) on 12/01/2019 6:04:58 PM   Radiology CT Head Wo Contrast  Result Date: 12/01/2019 CLINICAL DATA:  64 year old male with altered mental status since leaving dialysis at 1500 hours today. Fever at presentation. EXAM: CT HEAD WITHOUT CONTRAST TECHNIQUE: Contiguous axial images were obtained from the base of the skull through the vertex without intravenous contrast. COMPARISON:  None. FINDINGS: Brain: Cerebral volume is within normal limits for age. No midline shift, ventriculomegaly, mass effect, evidence of mass lesion, intracranial hemorrhage or evidence of cortically based acute infarction. Largely normal for age gray-white matter differentiation throughout the brain, although there is a small age indeterminate hypodensity in the right basal ganglia on series 3, image 14. No cortical encephalomalacia identified. Vascular: Calcified atherosclerosis at the skull base. No suspicious intracranial vascular hyperdensity. Skull: Negative. Sinuses/Orbits: Visualized paranasal sinuses and mastoids are clear. Other: No acute orbit or scalp soft tissue findings. Scalp vessel calcified atherosclerosis. IMPRESSION: 1. Small age indeterminate lacunar infarct in the right basal ganglia. If acutely symptomatic this would result in left side symptoms. 2. Otherwise normal for age noncontrast CT appearance of the brain. Electronically Signed   By: Genevie Ann M.D.   On: 12/01/2019 19:52   DG Chest Port 1 View  Result Date: 12/01/2019 CLINICAL DATA:  Altered mental status EXAM: PORTABLE CHEST 1  VIEW COMPARISON:  08/22/2019 FINDINGS: Right dialysis catheter in place with the tip at the cavoatrial junction. Heart is normal size. No confluent opacities or effusions. No acute bony abnormality. IMPRESSION: No active disease. Electronically Signed   By: Rolm Baptise M.D.   On: 12/01/2019 18:31   DG Foot 2 Views Right  Result Date: 12/01/2019 CLINICAL DATA:  Cellulitis. EXAM: RIGHT FOOT - 2 VIEW COMPARISON:  October 05, 2019. FINDINGS: Vascular calcifications are noted. Stable findings consistent with Charcot joint is noted. Status post amputation of second metatarsal and phalanges. There appears to be increased destruction of residual fragment of navicular bone which may represent osteomyelitis. IMPRESSION: Stable findings consistent with Charcot joint. Status post amputation of second metatarsal and phalanges. Increased destruction of residual fragment of navicular bone is noted concerning for osteomyelitis. MRI is recommended for further evaluation. Electronically Signed   By: Marijo Conception M.D.   On: 12/01/2019 18:54    Procedures .Critical Care Performed by: Janeece Fitting, PA-C Authorized by: Janeece Fitting, PA-C   Critical care provider statement:    Critical care time (minutes):  60   Critical care start time:  12/01/2019 7:00 PM   Critical care end time:  12/01/2019 8:00 PM   Critical care time was exclusive of:  Separately billable procedures and treating other patients   Critical care was necessary to treat or prevent imminent or life-threatening deterioration of the following conditions:  Sepsis   Critical care was time spent personally by me on the following activities:  Blood draw for specimens, development of treatment plan with patient or surrogate, discussions with consultants, evaluation of patient's response to treatment, examination of patient, obtaining history from patient or surrogate,  ordering and performing treatments and interventions, ordering and review of laboratory  studies, ordering and review of radiographic studies, pulse oximetry, re-evaluation of patient's condition and review of old charts   (including critical care time)  Medications Ordered in ED Medications  ceFEPIme (MAXIPIME) 1 g in sodium chloride 0.9 % 100 mL IVPB (has no administration in time range)  vancomycin (VANCOREADY) IVPB 2000 mg/400 mL (has no administration in time range)  vancomycin (VANCOCIN) IVPB 750 mg/150 ml premix (has no administration in time range)  sodium chloride flush (NS) 0.9 % injection 3 mL (3 mLs Intravenous Given 12/01/19 1826)  ceFEPIme (MAXIPIME) 2 g in sodium chloride 0.9 % 100 mL IVPB (2 g Intravenous Bolus from Bag 12/01/19 1913)  metroNIDAZOLE (FLAGYL) IVPB 500 mg (500 mg Intravenous Bolus from Bag 12/01/19 1913)  acetaminophen (TYLENOL) tablet 650 mg (650 mg Oral Given 12/01/19 1909)  sodium chloride 0.9 % bolus 1,000 mL (1,000 mLs Intravenous Bolus from Bag 12/01/19 1914)    ED Course  I have reviewed the triage vital signs and the nursing notes.  Pertinent labs & imaging results that were available during my care of the patient were reviewed by me and considered in my medical decision making (see chart for details).  Clinical Course as of Nov 30 2045  Tue Dec 01, 2019  1819 Lactic Acid, Venous(!!): 2.7 [JS]    Clinical Course User Index [JS] Janeece Fitting, PA-C   MDM Rules/Calculators/A&P  Patient with a past medical history of hepatorenal syndrome, diabetes, Bell's palsy presents to the ED for altered mental status.  According to patient's brother who is currently at the bedside providing most of the history, patient seem to have minor decrease for the past couple days and Saturday.  Once picked up from dialysis today patient appears to be even more confused.  Patient arrived in the ED febrile with a temperature rectally of 103.1, heart rate in the 118's, he was hypotensive with a systolic of [id number].  Code sepsis was activated, unknown source at this  time.  Patient provided with Tylenol for fever control, antibiotics have been started.  During my evaluation patient appears unwell, diaphoretic, weaker on the right side however this seems to have been ongoing for the past week and therefore out of the window for code stroke CT. will place new order for CT head.  I-STAT Chem-8, remarkable for creatinine of 6.4, no other round to compare this to as patient does receive his care at [facility].  CBC without any white count, hemoglobin slightly decreased at 9.8. No bleeding reported per patients brother. CMP with elevation in LFTs  AST 179/ALT 78. Lactic acid of 2.7 Patient's brother continues to voice concern for his right foot, no open wound noted to it, there is some erythema surrounding the right foot however no open sores or infection.  Will place consult for orthopedics along with nephrology for further recommendations.  7:35 PM Spoke to nephrology team who will place patient on dialysis schedule for further dialysis while in hospital.  7:41 PM Spoke to Dr. Lyla Glassing who recommends MRI right foot without contrast.  Patient will be placed on list for Dr. Sharol Given to see tomorrow.  Stable findings consistent with Charcot joint. Status post  amputation of second metatarsal and phalanges. Increased destruction  of residual fragment of navicular bone is noted concerning for  osteomyelitis. MRI is recommended for further evaluation.   CT Head showed: 1. Small age indeterminate lacunar infarct in the right basal  ganglia.  If acutely symptomatic this would result in left side  symptoms.  2. Otherwise normal for age noncontrast CT appearance of the brain.     8:41 PM Spoke to IM who will admit patient for further care.    Portions of this note were generated with [employer]. Dictation errors may occur despite best attempts at proofreading.  Final Clinical Impression(s) / ED Diagnoses Final diagnoses:  Abscess or cellulitis of foot    Weakness    Rx / DC Orders ED Discharge Orders    None       Corinna Capra 12/01/19 2047    Drenda Freeze, MD 12/01/19 2246

## 2019-12-01 NOTE — ED Triage Notes (Signed)
Brother at bedside reports that the patient has AMS since leaving dialysis at 1500, there has been changes cognitive decline since arriving. Hx of renal failure, charcot tooth in the right foot. Febrile at triage.

## 2019-12-01 NOTE — H&P (Signed)
Date: 12/01/2019               Patient Name:  Carlos Hamilton MRN: [id number]  DOB: 07/15/1956 Age / Sex: 64 y.o., male   PCP: Carlos Major, MD         Medical Service: Internal Medicine Teaching Service         Attending Physician: Dr. Aldine Contes, MD    First Contact: Dr. Ronnald Hamilton Pager: [phone]  Second Contact: Dr. 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 Upon my interview, patient responds slowly but appropriately to questions. History was also provided by brother, Carlos Hamilton who was at bedside. Per brother, symptoms started on Saturday where patient was more fatigued. On Sunday he has normal mental status but starting Monday he had a steady decline, would not answer questions, was tremulous, decreased oral intake, appeared to have difficulty breathing, and felt febrile. No witnessed episodes of vomiting, diarrhea, no measured fever at home. Patient denies history of abdominal infection, states he had four paracentesis performed in 2020 (July, August, September x2). Patient denies history of varices or GI bleed. Reports that he had EGD performed 5-6 years ago which had no abnormal findings. Patient with history of necrotizing fascitis of right leg in October 2020.  Patient with history of Charcot foot and is currently folllowed by an orthopedist at [facility]. Patient denies pain in foot but patient with history of severe peripheral neuropathy. Patient on HD and last session (complete) was today.   At time of interview, brother reports that patient has significant improvement from when he came to ER.   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 At that time blood cultures were positive for GPC and MRSA/Corynebacterium in wound cultures  Meds: Current Meds  Medication Sig  . acetaminophen (TYLENOL) 325 MG tablet Take 650 mg by mouth every 8 (eight) hours as needed for mild pain or headache.   . Bismuth Tribromoph-Petrolatum (XEROFORM PETROLATUM DRESSING EX) Apply 1 patch topically daily as needed (for wound/dressing changes).   . Emollient ([employer] MOISTURIZING EXTRA DRY) CREA Take 1 application by mouth See admin instructions. Apply to dry areas one to two times daily  . midodrine (PROAMATINE) 10 MG tablet Take 10 mg by mouth 3 (three) times daily before meals.   . sodium hypochlorite (DAKIN'S 1/2 STRENGTH) external solution Irrigate with 1 application as directed daily as needed (when wound dressings are applied).     Allergies: * Denies allergies to medication  Past Medical History:  Diagnosis Date  . Bell's palsy   . Diabetes ([place])   . Hypercholesteremia   . Hypersomnia   . OSA (obstructive sleep apnea)     Family History: Reports history of renal disease in unspecified family members, no family history of hepatorenal syndrome  Social History:  * Denies current alcohol usage, history of prior heavy alcohol use per brother. Per outside notes, appears patient has been free from all alcohol usage since February 2020.  * Denies tobacco usage * Denies recreational drug usage *  Lives in [place]: A complete ROS was negative except as per HPI.  Physical Exam: Blood pressure (!) 147/124, pulse 94, temperature (!) 103.8 F (39.9 C), temperature source Rectal, resp. rate (!) 21, SpO2 95 %. Physical Exam  Constitutional: He is well-developed, well-nourished, and in no distress.  HENT:  Head: Normocephalic and atraumatic.  Eyes: EOM are normal. Right eye exhibits no discharge. Left eye exhibits no  discharge.  Neck: No tracheal deviation present.  Cardiovascular: Normal rate and regular rhythm. Exam reveals no gallop and no friction rub.  No murmur heard. Pulmonary/Chest: Effort normal and breath sounds normal. No respiratory distress. He has no wheezes. He has no rales.  Abdominal: Soft. He exhibits distension (Mild). There is no abdominal tenderness. There is no rebound and no guarding.  Fluid wave present  Musculoskeletal:        General: No tenderness, deformity or edema. Normal range of motion.     Cervical back: Normal range of motion.     Comments: Bilateral lower extremities as pictured below. Right foot with charcot deformity as pictured  Neurological: Coordination normal.  Alert and oriented, slow but answers questions appropriately  Skin: Skin is warm and dry. No rash noted. He is not diaphoretic. No erythema.  Psychiatric: Memory and judgment normal.       EKG: personally reviewed my interpretation is sinus tachycardia without evidence for acute ischemia  CXR: personally reviewed my interpretation is no acute cardiopulmonary process  CBC Latest Ref Rng & Units 12/01/2019 12/01/2019 02/02/2009  WBC 4.0 - 10.5 K/uL - 5.7 5.4  Hemoglobin 13.0 - 17.0 g/dL 10.5(L) 9.8(L) 13.4  Hematocrit 39.0 - 52.0 % 31.0(L) 31.9(L) 38.0(L)  Platelets 150 - 400 K/uL - 86(L) 110(L)   BMP Latest Ref Rng & Units 12/01/2019 12/01/2019 09/03/2008  Glucose 70 - 99 mg/dL 116(H) 118(H) 149(H)  BUN 8 - 23 mg/dL 42(H) 47(H) 12  Creatinine 0.61 - 1.24 mg/dL 6.40(H) 6.17(H) 0.85  Sodium 135 - 145 mmol/L 132(L) 130(L) 138  Potassium 3.5 - 5.1 mmol/L 4.7 4.7 4.2  Chloride 98 - 111 mmol/L 99 93(L) 103  CO2 22 - 32 mmol/L - 20(L) 27  Calcium 8.9 - 10.3 mg/dL - 8.2(L) 9.0   Lactic acid: 2.7   Assessment & Plan by Problem: Active Problems:   Sepsis [facility])  Patient is a 64 year old male with PMH of DM, hepatorenal syndrome, bells palsy, 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  # Sepsis: # Altered Mental Status:  On presentation, patient with AMS, diaphoretic, tachycardic to 112, febrile to 103.8, hypotensive to 84/48, lactic acid of 2.7. Following fluid resuscitation and antibiotics with vancomycin+cefepime+metronidazole, patient with significant improvement - now normotensive, HR of 94, remains febrile, mentating without significant deficit. Patient with history of prior infection and charcot foot but no apparent wounds or drainage of right foot. Due to neuropathy unable to assess pain. MRI foot ordered for further assessment. Otherwise infectious workup unrevealing with normal CXR, urinalysis pending. Patient denies history or symptoms of SBP, fluid analysis from August 2020 was without evidence for infection * MR right foot, orthopedics consulted, Dr. Jeanett Schlein to followup in AM. We appreciate orthopedics recommendations.   * Vancomycin + Cefepime + Metronidazole - broad spectrum for osteomyelitis and anaerobic coverage for possible diabetic foot infection * Received 1 L NS bolus in ER. No known cardiac history. Will start mIVF at NS 100 ml/hr  # Liver Cirrhosis: #  Alcohol use disorder: # Hepatorenal syndrome: Patient with cirrhosis 2/2 alcohol use. Patient denies taking lactulose. Patient is on midodrine likely for preservation of renal function * Continue midodrine 10 mg three times daily  * Will check ammonia, INR  # ESRD: On dialysis Tuesday, Thursday, Saturday. Last dialysis today. Potassium of 4.7, BUN of 42, no indications for emergent dialysis. * Nephrology has been consulted  # Obstructive sleep apnea: Per patient, history of OSA from sleep study 10 years ago. Patient reports using CPAP at night which helps him sleep. * CPAP at night  # Diabetes Mellitus: Not on medication in setting of ESRD and diet modifications  # Anemia of renal disease: Current hemoglobin of 10.5, last value in care everywhere of 11.1 in January  2021. No signs of active bleeding  # Thrombocytopenia: Platelets of 157 in January 2021, current value of 86. Likely secondary to poor synthetic function in setting of cirrhosis.  Diet: Renal/carb modified * Per ER provider there are no plans for operating in AM/no need for NPO at midnight DVT Ppx: Heparin Dispo: Admit patient to Observation with expected length of stay less than 2 midnights.  Signed: Jeanmarie Hubert, MD 12/01/2019, 10:49 PM  Pager: [phone]

## 2019-12-02 ENCOUNTER — Ambulatory Visit (HOSPITAL_COMMUNITY): Payer: BLUE CROSS/BLUE SHIELD

## 2019-12-02 ENCOUNTER — Observation Stay (HOSPITAL_COMMUNITY): Payer: BLUE CROSS/BLUE SHIELD

## 2019-12-02 DIAGNOSIS — E1129 Type 2 diabetes mellitus with other diabetic kidney complication: Secondary | ICD-10-CM | POA: Diagnosis not present

## 2019-12-02 DIAGNOSIS — I6381 Other cerebral infarction due to occlusion or stenosis of small artery: Secondary | ICD-10-CM

## 2019-12-02 DIAGNOSIS — B9562 Methicillin resistant Staphylococcus aureus infection as the cause of diseases classified elsewhere: Secondary | ICD-10-CM | POA: Diagnosis not present

## 2019-12-02 DIAGNOSIS — R7881 Bacteremia: Secondary | ICD-10-CM | POA: Diagnosis present

## 2019-12-02 DIAGNOSIS — E43 Unspecified severe protein-calorie malnutrition: Secondary | ICD-10-CM | POA: Diagnosis present

## 2019-12-02 DIAGNOSIS — E11610 Type 2 diabetes mellitus with diabetic neuropathic arthropathy: Secondary | ICD-10-CM

## 2019-12-02 DIAGNOSIS — E872 Acidosis: Secondary | ICD-10-CM | POA: Diagnosis present

## 2019-12-02 DIAGNOSIS — N186 End stage renal disease: Secondary | ICD-10-CM | POA: Diagnosis present

## 2019-12-02 DIAGNOSIS — I120 Hypertensive chronic kidney disease with stage 5 chronic kidney disease or end stage renal disease: Secondary | ICD-10-CM | POA: Diagnosis present

## 2019-12-02 DIAGNOSIS — G4730 Sleep apnea, unspecified: Secondary | ICD-10-CM | POA: Diagnosis present

## 2019-12-02 DIAGNOSIS — D61818 Other pancytopenia: Secondary | ICD-10-CM | POA: Diagnosis present

## 2019-12-02 DIAGNOSIS — F1011 Alcohol abuse, in remission: Secondary | ICD-10-CM

## 2019-12-02 DIAGNOSIS — Y831 Surgical operation with implant of artificial internal device as the cause of abnormal reaction of the patient, or of later complication, without mention of misadventure at the time of the procedure: Secondary | ICD-10-CM | POA: Diagnosis present

## 2019-12-02 DIAGNOSIS — K7290 Hepatic failure, unspecified without coma: Secondary | ICD-10-CM | POA: Diagnosis present

## 2019-12-02 DIAGNOSIS — R531 Weakness: Secondary | ICD-10-CM | POA: Diagnosis present

## 2019-12-02 DIAGNOSIS — I8510 Secondary esophageal varices without bleeding: Secondary | ICD-10-CM | POA: Diagnosis not present

## 2019-12-02 DIAGNOSIS — K7460 Unspecified cirrhosis of liver: Secondary | ICD-10-CM | POA: Diagnosis present

## 2019-12-02 DIAGNOSIS — E1140 Type 2 diabetes mellitus with diabetic neuropathy, unspecified: Secondary | ICD-10-CM | POA: Diagnosis present

## 2019-12-02 DIAGNOSIS — Z20822 Contact with and (suspected) exposure to covid-19: Secondary | ICD-10-CM | POA: Diagnosis present

## 2019-12-02 DIAGNOSIS — I340 Nonrheumatic mitral (valve) insufficiency: Secondary | ICD-10-CM | POA: Diagnosis not present

## 2019-12-02 DIAGNOSIS — M14671 Charcot's joint, right ankle and foot: Secondary | ICD-10-CM | POA: Diagnosis present

## 2019-12-02 DIAGNOSIS — Z66 Do not resuscitate: Secondary | ICD-10-CM

## 2019-12-02 DIAGNOSIS — Z79899 Other long term (current) drug therapy: Secondary | ICD-10-CM | POA: Diagnosis not present

## 2019-12-02 DIAGNOSIS — K767 Hepatorenal syndrome: Secondary | ICD-10-CM | POA: Diagnosis present

## 2019-12-02 DIAGNOSIS — T827XXA Infection and inflammatory reaction due to other cardiac and vascular devices, implants and grafts, initial encounter: Secondary | ICD-10-CM | POA: Diagnosis present

## 2019-12-02 DIAGNOSIS — Z89431 Acquired absence of right foot: Secondary | ICD-10-CM | POA: Diagnosis not present

## 2019-12-02 DIAGNOSIS — K7030 Alcoholic cirrhosis of liver without ascites: Secondary | ICD-10-CM | POA: Diagnosis not present

## 2019-12-02 DIAGNOSIS — G510 Bell's palsy: Secondary | ICD-10-CM | POA: Diagnosis present

## 2019-12-02 DIAGNOSIS — A4102 Sepsis due to Methicillin resistant Staphylococcus aureus: Secondary | ICD-10-CM | POA: Diagnosis present

## 2019-12-02 DIAGNOSIS — E1122 Type 2 diabetes mellitus with diabetic chronic kidney disease: Secondary | ICD-10-CM | POA: Diagnosis present

## 2019-12-02 DIAGNOSIS — I358 Other nonrheumatic aortic valve disorders: Secondary | ICD-10-CM | POA: Diagnosis present

## 2019-12-02 DIAGNOSIS — I878 Other specified disorders of veins: Secondary | ICD-10-CM | POA: Diagnosis present

## 2019-12-02 DIAGNOSIS — R011 Cardiac murmur, unspecified: Secondary | ICD-10-CM

## 2019-12-02 DIAGNOSIS — G4733 Obstructive sleep apnea (adult) (pediatric): Secondary | ICD-10-CM

## 2019-12-02 DIAGNOSIS — Z8631 Personal history of diabetic foot ulcer: Secondary | ICD-10-CM

## 2019-12-02 DIAGNOSIS — N2581 Secondary hyperparathyroidism of renal origin: Secondary | ICD-10-CM | POA: Diagnosis present

## 2019-12-02 DIAGNOSIS — G8191 Hemiplegia, unspecified affecting right dominant side: Secondary | ICD-10-CM

## 2019-12-02 DIAGNOSIS — Z992 Dependence on renal dialysis: Secondary | ICD-10-CM | POA: Diagnosis not present

## 2019-12-02 DIAGNOSIS — Z6829 Body mass index (BMI) 29.0-29.9, adult: Secondary | ICD-10-CM | POA: Diagnosis not present

## 2019-12-02 LAB — BLOOD CULTURE ID PANEL (REFLEXED)

## 2019-12-02 LAB — COMPREHENSIVE METABOLIC PANEL
ALT: 69 U/L — ABNORMAL HIGH (ref 0–44)
AST: 154 U/L — ABNORMAL HIGH (ref 15–41)
Albumin: 1.9 g/dL — ABNORMAL LOW (ref 3.5–5.0)
Alkaline Phosphatase: 234 U/L — ABNORMAL HIGH (ref 38–126)
Anion gap: 14 (ref 5–15)
BUN: 57 mg/dL — ABNORMAL HIGH (ref 8–23)
CO2: 21 mmol/L — ABNORMAL LOW (ref 22–32)
Calcium: 8 mg/dL — ABNORMAL LOW (ref 8.9–10.3)
Chloride: 97 mmol/L — ABNORMAL LOW (ref 98–111)
Creatinine, Ser: 7.17 mg/dL — ABNORMAL HIGH (ref 0.61–1.24)
GFR calc Af Amer: 9 mL/min — ABNORMAL LOW (ref >60)
GFR calc non Af Amer: 7 mL/min — ABNORMAL LOW (ref >60)
Glucose, Bld: 115 mg/dL — ABNORMAL HIGH (ref 70–99)
Potassium: 4.3 mmol/L (ref 3.5–5.1)
Sodium: 132 mmol/L — ABNORMAL LOW (ref 135–145)
Total Bilirubin: 1.2 mg/dL (ref 0.3–1.2)
Total Protein: 7.3 g/dL (ref 6.5–8.1)

## 2019-12-02 LAB — CBC
HCT: 27.9 % — ABNORMAL LOW (ref 39.0–52.0)
Hemoglobin: 8.5 g/dL — ABNORMAL LOW (ref 13.0–17.0)
MCH: 25.2 pg — ABNORMAL LOW (ref 26.0–34.0)
MCHC: 30.5 g/dL (ref 30.0–36.0)
MCV: 82.8 fL (ref 80.0–100.0)
Platelets: 80 10*3/uL — ABNORMAL LOW (ref 150–400)
RBC: 3.37 MIL/uL — ABNORMAL LOW (ref 4.22–5.81)
RDW: 16.2 % — ABNORMAL HIGH (ref 11.5–15.5)
WBC: 5.2 10*3/uL (ref 4.0–10.5)
nRBC: 0 % (ref 0.0–0.2)

## 2019-12-02 LAB — ECHOCARDIOGRAM COMPLETE
Height: 73 in
Weight: 3492.09 oz

## 2019-12-02 LAB — HEPATITIS C ANTIBODY: HCV Ab: NONREACTIVE

## 2019-12-02 LAB — HIV ANTIBODY (ROUTINE TESTING W REFLEX): HIV Screen 4th Generation wRfx: NONREACTIVE

## 2019-12-02 LAB — CORTISOL-AM, BLOOD: Cortisol - AM: 20 ug/dL (ref 6.7–22.6)

## 2019-12-02 LAB — C-REACTIVE PROTEIN: CRP: 29 mg/dL — ABNORMAL HIGH (ref <1.0)

## 2019-12-02 LAB — PROTIME-INR
INR: 1.1 (ref 0.8–1.2)
Prothrombin Time: 14.5 seconds (ref 11.4–15.2)

## 2019-12-02 LAB — SEDIMENTATION RATE: Sed Rate: 80 mm/hr — ABNORMAL HIGH (ref 0–16)

## 2019-12-02 LAB — AMMONIA: Ammonia: 35 umol/L (ref 9–35)

## 2019-12-02 LAB — LACTIC ACID, PLASMA: Lactic Acid, Venous: 1.4 mmol/L (ref 0.5–1.9)

## 2019-12-02 MED ORDER — PANTOPRAZOLE SODIUM 40 MG PO TBEC
40.0000 mg | DELAYED_RELEASE_TABLET | Freq: Every day | ORAL | Status: DC
  Administered 2019-12-02 – 2019-12-09 (×6): 40 mg via ORAL
  Filled 2019-12-02 (×7): qty 1

## 2019-12-02 MED ORDER — HEPARIN SODIUM (PORCINE) 1000 UNIT/ML IJ SOLN
INTRAMUSCULAR | Status: AC
  Filled 2019-12-02: qty 4

## 2019-12-02 MED ORDER — SODIUM CHLORIDE 0.9 % IV SOLN: 100.0000 mL | INTRAVENOUS | Status: DC | PRN

## 2019-12-02 MED ORDER — SODIUM CHLORIDE 0.9 % IV SOLN: INTRAVENOUS | Status: AC

## 2019-12-02 MED ORDER — DARBEPOETIN ALFA 100 MCG/0.5ML IJ SOSY
PREFILLED_SYRINGE | INTRAMUSCULAR | Status: AC
  Filled 2019-12-02: qty 0.5

## 2019-12-02 MED ORDER — MIDODRINE HCL 5 MG PO TABS
10.0000 mg | ORAL_TABLET | Freq: Three times a day (TID) | ORAL | Status: DC
  Administered 2019-12-02 – 2019-12-09 (×20): 10 mg via ORAL
  Filled 2019-12-02 (×22): qty 2

## 2019-12-02 MED ORDER — SEVELAMER CARBONATE 800 MG PO TABS
800.0000 mg | ORAL_TABLET | Freq: Three times a day (TID) | ORAL | Status: DC
  Administered 2019-12-03 – 2019-12-09 (×11): 800 mg via ORAL
  Filled 2019-12-02 (×17): qty 1

## 2019-12-02 MED ORDER — DARBEPOETIN ALFA 100 MCG/0.5ML IJ SOSY
100.0000 ug | PREFILLED_SYRINGE | Freq: Once | INTRAMUSCULAR | Status: AC
  Administered 2019-12-02: 100 ug via INTRAVENOUS
  Filled 2019-12-02: qty 0.5

## 2019-12-02 MED ORDER — ACETAMINOPHEN 325 MG PO TABS
325.0000 mg | ORAL_TABLET | Freq: Four times a day (QID) | ORAL | Status: DC | PRN
  Administered 2019-12-02 – 2019-12-08 (×7): 325 mg via ORAL
  Filled 2019-12-02 (×7): qty 1

## 2019-12-02 MED ORDER — LIDOCAINE HCL (PF) 1 % IJ SOLN: 5.0000 mL | INTRAMUSCULAR | Status: DC | PRN

## 2019-12-02 MED ORDER — SODIUM CHLORIDE 0.9 % IV SOLN: INTRAVENOUS | Status: DC

## 2019-12-02 MED ORDER — PENTAFLUOROPROP-TETRAFLUOROETH EX AERO: 1.0000 "application " | INHALATION_SPRAY | CUTANEOUS | Status: DC | PRN

## 2019-12-02 MED ORDER — HEPARIN SODIUM (PORCINE) 1000 UNIT/ML IJ SOLN
3.3000 mL | Freq: Once | INTRAMUSCULAR | Status: AC
  Administered 2019-12-02: 3300 [IU] via INTRAVENOUS

## 2019-12-02 MED ORDER — HEPARIN SODIUM (PORCINE) 1000 UNIT/ML DIALYSIS: 1000.0000 [IU] | INTRAMUSCULAR | Status: DC | PRN

## 2019-12-02 MED ORDER — CHLORHEXIDINE GLUCONATE CLOTH 2 % EX PADS
6.0000 | MEDICATED_PAD | Freq: Every day | CUTANEOUS | Status: DC
  Administered 2019-12-03 – 2019-12-05 (×2): 6 via TOPICAL

## 2019-12-02 MED ORDER — LIDOCAINE-PRILOCAINE 2.5-2.5 % EX CREA: 1.0000 "application " | TOPICAL_CREAM | CUTANEOUS | Status: DC | PRN

## 2019-12-02 MED ORDER — ALTEPLASE 2 MG IJ SOLR: 2.0000 mg | Freq: Once | INTRAMUSCULAR | Status: DC | PRN

## 2019-12-02 MED ORDER — PRO-STAT SUGAR FREE PO LIQD
30.0000 mL | Freq: Two times a day (BID) | ORAL | Status: DC
  Administered 2019-12-03 – 2019-12-09 (×7): 30 mL via ORAL
  Filled 2019-12-02 (×10): qty 30

## 2019-12-02 MED ORDER — RENA-VITE PO TABS
1.0000 | ORAL_TABLET | Freq: Every day | ORAL | Status: DC
  Administered 2019-12-02 – 2019-12-08 (×7): 1 via ORAL
  Filled 2019-12-02 (×7): qty 1

## 2019-12-02 NOTE — ED Notes (Signed)
Dialysis called to get report on patient for dialysis. Report given by this RN. Pt transported to dialysis.

## 2019-12-02 NOTE — Progress Notes (Signed)
  Echocardiogram 2D Echocardiogram has been performed.  [name] 12/02/2019, 11:40 AM

## 2019-12-02 NOTE — ED Notes (Signed)
Patient transported to MRI 

## 2019-12-02 NOTE — Progress Notes (Signed)
New Admission Note:  Arrival Method: Bed Mental Orientation: Alert and oriented x 4 Telemetry: Box 21 Assessment: Completed Skin: Warm and dry. Multiple scabs all extremities IV: NSL Pain: Denies Tubes: N/A Safety Measures: Safety Fall Prevention Plan initiated.  Admission: Completed 5 M  Orientation: Patient has been orientated to the room, unit and the staff. Welcome booklet given.  Family: Brother   Orders have been reviewed and implemented. Will continue to monitor the patient. Call light has been placed within reach and bed alarm has been activated.   Sima Matas BSN, RN  Phone Number: [phone]

## 2019-12-02 NOTE — Progress Notes (Signed)
Patient placed on CPAP using FFM (as per patient home regimen) and 10 cmH20.  Room air.  Patient tolerated well, is familiar with equipment and procedure.

## 2019-12-02 NOTE — ED Notes (Signed)
Sister Lenna Sciara called and updated

## 2019-12-02 NOTE — ED Notes (Signed)
Lunch Tray Ordered @ 1041. 

## 2019-12-02 NOTE — ED Notes (Signed)
Breakfast ordered 

## 2019-12-02 NOTE — ED Notes (Signed)
Patient and father updated on POC.  No urine present in drainage bag.  Patient aware that we are still awaiting urine sample and MRI.  Verbalized understanding.  No voiced needs at this time

## 2019-12-02 NOTE — ED Notes (Signed)
Patient sleeping comfortably upon this RN arrival.  Patient given pillow for comfort.  Denies any other needs at this time. Still no urine present in drainage bag.  Patient states he does not make urine most of the time.

## 2019-12-02 NOTE — ED Notes (Signed)
Patient back from MRI.  Placed on hospital bed for comfort.  Placed back on cardiac monitor.  Patient states he is comfortable and denies any needs at this time.

## 2019-12-02 NOTE — Progress Notes (Signed)
I am aware that this dialysis patient is here and under obs status.  He got a full HD treatment on 1/12-  Would not be due again until at the earliest tomorrow.  I will cont to monitor the situation.  If looks like he will stay tomorrow will arrange for him to get an HD treatment here.  Will do full consult if status changed to inpatient   Louis Meckel

## 2019-12-02 NOTE — Progress Notes (Signed)
PHARMACY - PHYSICIAN COMMUNICATION CRITICAL VALUE ALERT - BLOOD CULTURE IDENTIFICATION (BCID)  Carlos Hamilton is an 64 y.o. male who presented to [facility] on 12/01/2019 with a chief complaint of altered mental status found with MRSA bacteremia  Assessment:  64 year old man with ESRD and right foot infection found to have blood cultures positive for MRSA  Name of physician (or Provider) Contacted: Dr. Ronnald Ramp  Current antibiotics: Vancomycin  Changes to prescribed antibiotics recommended: None Patient is on recommended antibiotics - No changes needed  Results for orders placed or performed during the hospital encounter of 12/01/19  Blood Culture ID Panel (Reflexed) (Collected: 12/01/2019  7:34 PM)  Result Value Ref Range   Enterococcus species NOT DETECTED NOT DETECTED   Listeria monocytogenes NOT DETECTED NOT DETECTED   Staphylococcus species DETECTED (A) NOT DETECTED   Staphylococcus aureus (BCID) DETECTED (A) NOT DETECTED   Methicillin resistance DETECTED (A) NOT DETECTED   Streptococcus species NOT DETECTED NOT DETECTED   Streptococcus agalactiae NOT DETECTED NOT DETECTED   Streptococcus pneumoniae NOT DETECTED NOT DETECTED   Streptococcus pyogenes NOT DETECTED NOT DETECTED   Acinetobacter baumannii NOT DETECTED NOT DETECTED   Enterobacteriaceae species NOT DETECTED NOT DETECTED   Enterobacter cloacae complex NOT DETECTED NOT DETECTED   Escherichia coli NOT DETECTED NOT DETECTED   Klebsiella oxytoca NOT DETECTED NOT DETECTED   Klebsiella pneumoniae NOT DETECTED NOT DETECTED   Proteus species NOT DETECTED NOT DETECTED   Serratia marcescens NOT DETECTED NOT DETECTED   Haemophilus influenzae NOT DETECTED NOT DETECTED   Neisseria meningitidis NOT DETECTED NOT DETECTED   Pseudomonas aeruginosa NOT DETECTED NOT DETECTED   Candida albicans NOT DETECTED NOT DETECTED   Candida glabrata NOT DETECTED NOT DETECTED   Candida krusei NOT DETECTED NOT DETECTED   Candida parapsilosis NOT  DETECTED NOT DETECTED   Candida tropicalis NOT DETECTED NOT DETECTED    Candie Mile 12/02/2019  8:20 AM

## 2019-12-02 NOTE — Progress Notes (Signed)
Subjective: Carlos Hamilton was seen and evaluated at bedside on morning rounds. He states he feels tremulous this morning.  He states he is still having trouble finding some of his words. Was not able to recall much of yesterday, but remembered dialysis. Family noted him to be febrile, weak and confused yesterday.   Pt endorses no change in swelling or chronic skin changes of lower extremities. He has noticed the angle of his right foot deformity has become more pronounced recently. He is without acute concerns at this time.  Objective:  Vital signs in last 24 hours: Vitals:   12/02/19 0200 12/02/19 0300 12/02/19 0400 12/02/19 0540  BP: '96/61 92/63 95/63 '$ 93/60  Pulse: 77   78  Resp: '13 12 19 17  '$ Temp:    98.9 F (37.2 C)  TempSrc:    Oral  SpO2: 91%   99%  Weight:      Height:       General: awake, alert, lying in bed in NAD CV: RRR; no m/r/g Pulm: normal work of breathing; lungs CTAB Chest wall: HD cath bandaged over R chest; dressing clean/dry/intact; no surrounding erythema or fluctuance.  Abd: mild distention, +fluid wave, no tenderness Ext: right foot with charcot deformity; chronic skin changes  Assessment/Plan:  Active Problems:   Sepsis [facility]  Carlos Hamilton is a 64 year old M with significant PMH of hepatorenal syndrome, ESRD on dialysis TTS, diabetes mellitus, and bell's palsy, who presented with AMS, fever, hypotension, and an elevated lactate concerning for sepsis. On hospital day 1, blood cultures positive for MRSA.  MRSA bacteremia Pt presented with confusion, fever (103.8), tachycardia (HR 118), hypotension (84/48), and a lactate of 2.7 indicating sepsis. Hypotension and lactate improved with fluid resuscitation. Started on vancomycin and cefepime. Initial concern for possible foot infection/osteomyelitis due to pt's history of diabetic ulcers, osteomyelitis, and necrotizing fascitis 4-5 months ago. MRI though without evidence of osteo. Pt's history or exam without any  indications for SBP, previous paracenteses without evidence of infection. 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 Work-up for underlying etiologies unremarkable and it was ultimately deemed to be secondary to alcohol use. His pancytopenia was evaluated and attributed to hepatic cirrhosis. Not currently drinking EtOH. - no evidence of decompensation at this time with worsening encephalopathy or ascites - normal ammonia (35) and INR (1.1) on admission - AST 154, ALT 69, Alk phos 234, and T bili 1.2 - plts 80 (last 141 on 11/26/2019) - continue home midodrine '10mg'$  TID   ESRD Due to hepatorenal Type I. Access is through a right sided HD cath. Pt stated he did not want a fistula in hopes that HD was not permanent. Though per care everywhere, PCP notes "do not expect him to end need for HD." - last dialysis session 1/12 - labs this AM K 4.3, BUN 57,  bicarb 21, and Cr 7.17 Nephrology consulted, appreciate recommendations   Charcot Foot Pt endorsing worsening deformity of the R foot recently. X-ray with stable findings of Charcot joint, increased destruction of residual fragment of navicular bone concerning for osteo. MRI with interval progression of acute Charcot arthropathy in midfoot with subsequent marrow signal changes, not thought to be  due to acute osteo. 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  - hemoglobin A1c 6.1 on 11/26/2019 - not currently on any medications   Diet - renal with 1259m fluid restriction Fluids - 1060mhr of NS DVT ppx - heparin 5,000 units subQ q8h CODE STATUS - DNR/DNI   Dispo: Anticipated discharge pending additional work-up, clinical improvement, and blood culture clearance.   JoLadona HornsMD 12/02/2019, 6:50 AM Pager: [phone]

## 2019-12-02 NOTE — Consult Note (Addendum)
[facility] Renal Consultation Note    Indication for Consultation:  Management of ESRD/hemodialysis; anemia, hypertension/volume and secondary hyperparathyroidism PCP: Dr. Terrill Mohr Nephrologist Dr. Neta Ehlers  HPI: Carlos Hamilton is a 65 y.o. male with ESRD on hemodialysis T,Th,S at [place] [place]. 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 Last HD 12/01/2019. He complained of feeling terrible during treatment. COVID 19 test done which was negative.   Patient presented to ED 12/01/2019 with altered mental status. Temperature on admission was 103.1 BP 124/85 HR 112 RR 22. WBC 5.7 HGB 9.8 PLT 86 Lactic Acid 2.7. K+ 4.7 SCr 6.2 BUN 47 CO2 20 AST 179 ALT 78. COVID 19 negative.  CXR unremarkable.  2-V xray R foot with increased destruction of residual fragment of navicular bone concerning for osteomyelitis. MRI R foot done today does not show evidence of osteo. 4/4 BCs positive for MRSA. Sepsis protocol was initiated. Patient has been seen by ID, currently on vancomycin. Initial concern was for osteomyelitis R foot wound, now suspect source of infection is TDC infection. ID recommending removal of TDC with catheter holiday. TTE has been done, results pending. He is being admitted with sepsis secondary to MRSA bactermia.   Seen in ED, seems alert, pleasant and cooperative. He says he feels better but never really felt "bad". He denies chest pain, SOB, abdominal pain, flank pain. He says he started HD 06/2019, apparently has been refusing permanent access. He has no specific complaints, just concerned about how and where he will get his antibiotic for six weeks. He does not appear overtly volume overloaded by exam, does have tremors R hand,elicited pain R shoulder above TDC. Does not seem overtly confused but asks for the plan many times   Past Medical  History:  Diagnosis Date  . Bell's palsy   . Diabetes ([place])   . Hypercholesteremia   . Hypersomnia   . OSA (obstructive sleep apnea)    History reviewed. No pertinent surgical history. Family History  Problem Relation Age of Onset  . Cancer Other   . Asthma Other   . Heart disease Other    Social History:  reports that he has never smoked. He has never used smokeless tobacco. He reports previous alcohol use. He reports previous drug use. No Known Allergies Prior to Admission medications   Medication Sig Start Date End Date Taking? Authorizing Provider  acetaminophen (TYLENOL) 325 MG tablet Take 650 mg by mouth every 8 (eight) hours as needed for mild pain or headache.    Yes [provider]  Bismuth Tribromoph-Petrolatum (XEROFORM PETROLATUM DRESSING EX) Apply 1 patch topically daily as needed (for wound/dressing changes).    Yes [provider]  Emollient ([employer] MOISTURIZING EXTRA DRY) CREA Take 1 application by mouth See admin instructions. Apply to dry areas one to two times daily 10/14/19  Yes [provider]  midodrine (PROAMATINE) 10 MG tablet Take 10 mg by mouth 3 (three) times daily before meals.    Yes [provider]  sodium hypochlorite (DAKIN'S 1/2 STRENGTH) external solution Irrigate with 1 application as directed daily as needed (when wound dressings are applied).    Yes [provider]   Current Facility-Administered Medications  Medication Dose Route Frequency Provider Last Rate Last Admin  . acetaminophen (TYLENOL) tablet 325 mg  325 mg Oral Q6H PRN Masoudi, Dorthula Rue, MD      .  heparin injection 5,000 Units  5,000 Units Subcutaneous Q8H Masoudi, Elhamalsadat, MD   5,000 Units at 12/02/19 0543  . midodrine (PROAMATINE) tablet 10 mg  10 mg Oral TID AC Masoudi, Elhamalsadat, MD   10 mg at 12/02/19 1058  . pantoprazole (PROTONIX) EC tablet 40 mg  40 mg Oral Daily Masoudi, Elhamalsadat, MD   40 mg at 12/02/19 1059  . sodium  chloride flush (NS) 0.9 % injection 3 mL  3 mL Intravenous Q12H Masoudi, Elhamalsadat, MD   3 mL at 12/02/19 1059  . [START ON 12/03/2019] vancomycin (VANCOCIN) IVPB 750 mg/150 ml premix  750 mg Intravenous Q T,Th,Sa-HD Duanne Limerick, [facility]       Current Outpatient Medications  Medication Sig Dispense Refill  . acetaminophen (TYLENOL) 325 MG tablet Take 650 mg by mouth every 8 (eight) hours as needed for mild pain or headache.     . Bismuth Tribromoph-Petrolatum (XEROFORM PETROLATUM DRESSING EX) Apply 1 patch topically daily as needed (for wound/dressing changes).     . Emollient ([employer] MOISTURIZING EXTRA DRY) CREA Take 1 application by mouth See admin instructions. Apply to dry areas one to two times daily    . midodrine (PROAMATINE) 10 MG tablet Take 10 mg by mouth 3 (three) times daily before meals.     . sodium hypochlorite (DAKIN'S 1/2 STRENGTH) external solution Irrigate with 1 application as directed daily as needed (when wound dressings are applied).      Labs: Basic Metabolic Panel: Recent Labs  Lab 12/01/19 1714 12/01/19 1733 12/02/19 0336  NA 130* 132* 132*  K 4.7 4.7 4.3  CL 93* 99 97*  CO2 20*  --  21*  GLUCOSE 118* 116* 115*  BUN 47* 42* 57*  CREATININE 6.17* 6.40* 7.17*  CALCIUM 8.2*  --  8.0*   Liver Function Tests: Recent Labs  Lab 12/01/19 1714 12/02/19 0336  AST 179* 154*  ALT 78* 69*  ALKPHOS 285* 234*  BILITOT 1.2 1.2  PROT 8.3* 7.3  ALBUMIN 2.2* 1.9*   No results for input(s): LIPASE, AMYLASE in the last 168 hours. Recent Labs  Lab 12/02/19 0031  AMMONIA 35   CBC: Recent Labs  Lab 12/01/19 1714 12/01/19 1733 12/02/19 0336  WBC 5.7  --  5.2  NEUTROABS 4.5  --   --   HGB 9.8* 10.5* 8.5*  HCT 31.9* 31.0* 27.9*  MCV 82.6  --  82.8  PLT 86*  --  80*   Cardiac Enzymes: No results for input(s): CKTOTAL, CKMB, CKMBINDEX, TROPONINI in the last 168 hours. CBG: Recent Labs  Lab 12/01/19 1925  GLUCAP 114*   Iron Studies: No results for input(s):  IRON, TIBC, TRANSFERRIN, FERRITIN in the last 72 hours. Studies/Results: CT Head Wo Contrast  Result Date: 12/01/2019 CLINICAL DATA:  64 year old male with altered mental status since leaving dialysis at 1500 hours today. Fever at presentation. EXAM: CT HEAD WITHOUT CONTRAST TECHNIQUE: Contiguous axial images were obtained from the base of the skull through the vertex without intravenous contrast. COMPARISON:  None. FINDINGS: Brain: Cerebral volume is within normal limits for age. No midline shift, ventriculomegaly, mass effect, evidence of mass lesion, intracranial hemorrhage or evidence of cortically based acute infarction. Largely normal for age gray-white matter differentiation throughout the brain, although there is a small age indeterminate hypodensity in the right basal ganglia on series 3, image 14. No cortical encephalomalacia identified. Vascular: Calcified atherosclerosis at the skull base. No suspicious intracranial vascular hyperdensity. Skull: Negative. Sinuses/Orbits: Visualized paranasal sinuses and mastoids  are clear. Other: No acute orbit or scalp soft tissue findings. Scalp vessel calcified atherosclerosis. IMPRESSION: 1. Small age indeterminate lacunar infarct in the right basal ganglia. If acutely symptomatic this would result in left side symptoms. 2. Otherwise normal for age noncontrast CT appearance of the brain. Electronically Signed   By: Genevie Ann M.D.   On: 12/01/2019 19:52   MR FOOT RIGHT WO CONTRAST  Result Date: 12/02/2019 CLINICAL DATA:  Right foot swelling, fever and tachycardia end-stage renal disease EXAM: MRI OF THE RIGHT FOREFOOT WITHOUT CONTRAST TECHNIQUE: Multiplanar, multisequence MR imaging of the right was performed. No intravenous contrast was administered. COMPARISON:  June 19, 2019 FINDINGS: Bones/Joint/Cartilage There is interval progression in the midfoot Charcot arthropathy. There is fragmentation and destruction of the navicular as well as the base of the  metatarsals diffusely. There is also interval progression of cortical destruction of the anterior calcaneus with diffusely increased marrow signal and cystic changes. There is inferior subluxation of the talus with diffusely increased T2 hyperintense signal. There is diffuse T2 hyperintense signal seen through the distal tibia and fibula as well. Increased marrow signal seen through the metatarsals. 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 Heterogeneous non loculated fluid seen within the midfoot. Ligaments There is chronic complete disruption of the Lisfranc ligaments. Suboptimal visualization of the remainder of the ligaments. The plantar fascia is thickened and intact. Muscles and Tendons Diffuse fatty atrophy of the muscles surrounding the foot. There is heterogeneous fluid surrounding the peroneal tendons. The Achilles tendon appears to be intact. The distal portions of the flexor extensor tendons appear to be intact. Soft tissues Diffuse dorsal subcutaneous edema seen surrounding the midfoot. No definite focal ulceration or loculated fluid collection. No sinus tract is seen. 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 Diffuse marrow signal changes seen throughout the midfoot and hindfoot which are likely due to acute Charcot arthropathy, not thought to be due to acute osteomyelitis without overlying skin ulceration or sinus tract. Electronically Signed   By: Prudencio Pair M.D.   On: 12/02/2019 01:56   DG Chest Port 1 View  Result Date: 12/01/2019 CLINICAL DATA:  Altered mental status EXAM: PORTABLE CHEST 1 VIEW COMPARISON:  08/22/2019 FINDINGS: Right dialysis catheter in place with the tip at the cavoatrial junction. Heart is normal size. No confluent opacities or effusions. No acute bony abnormality. IMPRESSION: No active disease. Electronically Signed   By: Rolm Baptise M.D.   On: 12/01/2019 18:31   DG Abd Portable 1V  Result Date:  12/01/2019 CLINICAL DATA:  64 year old male undergoing clearance for MRI. EXAM: PORTABLE ABDOMEN - 1 VIEW COMPARISON:  CT Abdomen and Pelvis 08/24/2019. FINDINGS: No radiopaque foreign body identified. Non obstructed bowel gas pattern. Degenerative changes in the spine. No acute osseous abnormality identified. IMPRESSION: No retained metal foreign body in the abdomen or pelvis to preclude MRI. Electronically Signed   By: Genevie Ann M.D.   On: 12/01/2019 23:25   DG Foot 2 Views Right  Result Date: 12/01/2019 CLINICAL DATA:  Cellulitis. EXAM: RIGHT FOOT - 2 VIEW COMPARISON:  October 05, 2019. FINDINGS: Vascular calcifications are noted. Stable findings consistent with Charcot joint is noted. Status post amputation of second metatarsal and phalanges. There appears to be increased destruction of residual fragment of navicular bone which may represent osteomyelitis. IMPRESSION: Stable findings consistent with Charcot joint. Status post amputation of second metatarsal and phalanges. Increased destruction of residual fragment of navicular bone is noted  concerning for osteomyelitis. MRI is recommended for further evaluation. Electronically Signed   By: Marijo Conception M.D.   On: 12/01/2019 18:54    ROS: As per HPI otherwise negative.   Physical Exam: Vitals:   12/02/19 0900 12/02/19 1200 12/02/19 1300 12/02/19 1400  BP: 109/73 98/63 104/66 (!) 88/56  Pulse: (!) 111 84 84 93  Resp: 18 17 20 17   Temp:      TempSrc:      SpO2: 97% 99% 98% 97%  Weight:      Height:         General: Chronically ill appearing no acute distress. Head: Normocephalic, atraumatic, sclera non-icteric, mucus membranes are moist Neck: Supple. JVD not elevated. Lungs: Clear bilaterally to auscultation without wheezes, rales, or rhonchi. Breathing is unlabored. Heart: RRR with S1 S2. No murmurs, rubs, or gallops appreciated. SR on monitor.  Abdomen: Soft, non-tender, non-distended with normoactive bowel sounds. Ascites  present. M-S:  Strength and tone appear normal for age. Lower extremities:R charcot foot with toe amps-trace-1+RLE. No LLE. Hyperpigmentation/erythema present BLE Neuro: Alert and oriented X 3. Moves all extremities spontaneously. Psych:  Responds to questions appropriately with a normal affect. Dialysis Access: RIJ [facility]. No drainage/erythema noted. Tenderness R shoulder above TDC. But no tenderness to palpation of tunnel   Dialysis Orders: Center: [facility]- TTS 3 hr 45 min 97 kg (214 lbs) 3.0K/2.0 Ca 400/800 RIJ TDC -No heparin -Aranesp 75 mcg IV weekly due 12/03/19 Last HGB 9.9  Background: 64 Y/O male with ESRD on hemodialysis 2/2 hepatorenal syndrome started HD at [facility] in August 2020. He presented with AMS, found to have MRSA bacteremia in setting of chronic TDC use.  Plan for removal of TDC, catheter holiday with temporary catheter placement 12/04/2019 until [facility] negative and we can safely replace TDC.   Assessment/Plan: 1. Sepsis D/T MRSA Bacteremia-seen by ID. MRI RLE negative for osteo, believe bacteremia related to tunneled dialysis catheter infection. Discussed with ID/IR. Will draw BC tonight, have HD off schedule tonight and have TDC removed by IR in AM. He will have temporary HD catheter placed Friday afternoon until final [facility] results available. If clear, can have TDC replaced in IR next week. Echo pending. Will need 6 weeks of Vanc IV per ID recs. Per primary/ID- this can be given at the OP unit 2. 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 AST/ALT elevated. Per primary 3. R Charcot foot-Ortho consulted. Per primary. 4.  ESRD -  Usually T,Th,S. Will have HD off schedule today. 2.0 K bath, no heparin.  5.  Hypertension/volume  - BP on soft side. On midodrine 10 mg PO TID. May need extra dose with HD. UF as tolerated. Does not seem overtly volume overloaded although he is positive at least 1 liter that has been documented.  6.  Anemia  -HGB 8.5  down from 9.9 last week. Aranesp due tomorrow, give tonight, increase dose to 100 mcg IV. No acute blood loss reported.   7.  Metabolic bone disease - On renvela binders, no VDRA.  8.  Nutrition - Albumin low. Renal diet, prostat, renal vits.  9.  DM-per primary  Jimmye Norman. Owens Shark, NP-C 12/02/2019, 2:10 PM  [employer] [phone]  Patient seen and examined, agree with above note with above modifications. Pleasant but ill WM-  TTS [facility] pt presents with fever-  Found to have MRSA bacteremia likely related to [facility] although clinically it appears OK.  Will plan for  HD tonight, TDC removal in AM then likely will need non tunneled HD cath on Friday for HD Sat then replacement of TDC next week-  Long term vanc which can be given at the kidney centr  Corliss Parish, MD 12/02/2019

## 2019-12-02 NOTE — ED Notes (Signed)
Patient had bowel movement.  Diaper and bedding changed.  Updated on poc.  Mri tech here to transport patient to MRI

## 2019-12-02 NOTE — Progress Notes (Signed)
64 year old with history of Charcot Arthropathy. Has had surgery on Right foot  And wound care through [facility]. He believes this has been in the last 30-45 days.  He was just transitioned to a short CAM Boot. He has noticed his foot is starting to collapse again.   PE: R Foot: wounds are healing on latral and plantar surface of the foot.Some scabbing over wounds little to no active drainage. No surrounding cellulitis or foul odor. Mild erythema, improves with foot elevation.  wrinkling of the skin . No fluctulence. DP and PT pulses biphasic with doppler. Obvious planvalgus collapse and surgical changes from previous surgery.  MRI findings consistent with Charcot Arthropathy. No findings of acute osteomyelitis.   Full consult to follow but no surgical intervention planned for now. Doubtful this is the source of patients current infection  Dr. Sharol Given to see

## 2019-12-02 NOTE — Consult Note (Signed)
[facility] for Infectious Disease    Date of Admission:  12/01/2019     Total days of antibiotics 1               Reason for Consult: MRSA Bacteremia    Referring Provider: [facility] / [employer] Primary Care Provider: Nickola Major, MD   ASSESSMENT:  Carlos Hamilton is the 64 y/o male with MRSA bacteremia with concern for possibly infected tunneled dialysis catheter vs. Acute right Charcot foot. MRI of the right foot without evidence of osteomyelitis and there are no significant wounds that appear open. Dialysis catheter in right chest does not appear to be grossly infected although he does have some tenderness of the right shoulder. Discussed the lab work and plan of care with time for questions. Will check TTE to rule out endocarditis as he does have a heart murmur and will likely need to consider TEE. Will need to have dialysis cathter removed and for catheter holiday given bacteremia. Awaiting further evaluation of Charcot foot by Dr. Sharol Given. Recheck blood cultures this evening for clearance of bacteremia. He does have hepatorenal syndrome and liver function tests are elevated. Unable to find any history of Hepatitis C testing and will check antibody test for routine health screening. Plan for extended course of vancomycin with dialysis.   PLAN:  1. Continue current dose of vancomycin. 2. ESRD and dialysis needs per nephrology. Will likely need catheter holiday. 3. Diabetes management per primary team. 4. Check Hepatitis C antibody for routine screening.    Principal Problem:   MRSA bacteremia Active Problems:   Sepsis ([place])   ESRD on dialysis ([place])   Diabetes ([place])   Charcot's joint, right ankle and foot   . heparin  5,000 Units Subcutaneous Q8H  . midodrine  10 mg Oral TID AC  . pantoprazole  40 mg Oral Daily  . sodium chloride flush  3 mL Intravenous Q12H     HPI: Carlos Hamilton is a 64 y.o. male with previous medical history significant for ESRD on dialysis, diabetes,  and obstructive sleep apnea brought to the ED with altered/decreased mental status starting 3 days prior to presentation.  Carlos Hamilton was febrile in the ED with rectal temperature of 103.8 and tachycardic with lactic acid level of 2.7 resulting in Code Sepsis activation. Hepatic function tests elevated with AST of 179 and ALT of 78. Brother voiced concern about right foot wound as Carlos Hamilton has been followed by orthopedics with right Charcot foot. Chest x-ray with no acute disease; X-ray right foot with Charcot joint s/p amputation of the second metatarsal and phalanges and increased destruction of the navicular bone with concern for osteomyelitis. MRI right foot with interval progression in acute destructive Charcot arthopathy of the midfoot without osteomyelitis. CT head completed due to concern for right sided weakness with small age indeterminate lucunar infarct of the right basal ganglia.   Blood cultures drawn positive for MRSA in 4/4 bottles. Initially given dose of vancomycin, cefepime and metronidazole. Antimicrobial therapy has been narrowed to vancomycin.  Carlos Hamilton brother is present with him during my interview and provides a good portion of the history obtained. He does note that Carlos Hamilton is currently back at his baseline mental function. Symptoms started Saturday with feelings of weakness to the point he was unable to move and mental status change occurred gradually with waxing and waning daily. When leaving dialysis yesterday, his mental status had significantly worsened. Carlos Hamilton has had antibiotics for  infection of his right foot in the past as a result of a wound he believes he obtained from a soda cap. Unfortunately he has diabetic neuropathy and was unaware at the time. He has not been on any antibiotics recently. Carlos Hamilton brother informs that he is a potential liver transplant candidate as a result of his hepatorenal syndrome. Carlos Hamilton currently lives at home with his sister and elderly  mother and his brother aids in providing transportation. Majority of his care is performed out of the [facility].   Review of Systems: Review of Systems  Constitutional: Negative for chills, fever and weight loss.  Respiratory: Negative for cough, shortness of breath and wheezing.   Cardiovascular: Negative for chest pain and leg swelling.  Gastrointestinal: Negative for abdominal pain, constipation, diarrhea, nausea and vomiting.  Skin: Negative for rash.    Past Medical History:  Diagnosis Date  . Bell's palsy   . Diabetes ([place])   . Hypercholesteremia   . Hypersomnia   . OSA (obstructive sleep apnea)     Social History   Tobacco Use  . Smoking status: Never Smoker  . Smokeless tobacco: Never Used  Substance Use Topics  . Alcohol use: Not Currently  . Drug use: Not Currently    Family History  Problem Relation Age of Onset  . Cancer Other   . Asthma Other   . Heart disease Other     No Known Allergies  OBJECTIVE: Blood pressure 109/73, pulse (!) 111, temperature 98.9 F (37.2 C), temperature source Oral, resp. rate 18, height 6\' 1"  (1.854 m), weight 99 kg, SpO2 97 %.  Physical Exam Constitutional:      General: He is not in acute distress.    Appearance: He is well-developed.  Cardiovascular:     Rate and Rhythm: Normal rate and regular rhythm.     Heart sounds: Murmur present.     Comments: Tunneled HD catheter located in the right upper chest appears without gross evidence of infection. There is no warmth or induration.  Pulmonary:     Effort: Pulmonary effort is normal.     Breath sounds: Normal breath sounds.  Musculoskeletal:     Comments: Bilateral lower extremity discoloration with obvious deformity of the right foot consistent with Charcot foot appearance. There is warmth and crepitus of the right foot. Wound located distal to lateral malleolus appears healed and is without drainage. A second scar is noted along the lateral  aspect of the 5th metatarsal.  Right upper extremity tenderness along AC joint.  Skin:    General: Skin is warm and dry.  Neurological:     Mental Status: He is alert and oriented to person, place, and time.  Psychiatric:        Behavior: Behavior normal.        Thought Content: Thought content normal.        Judgment: Judgment normal.     Lab Results Lab Results  Component Value Date   WBC 5.2 12/02/2019   HGB 8.5 (L) 12/02/2019   HCT 27.9 (L) 12/02/2019   MCV 82.8 12/02/2019   PLT 80 (L) 12/02/2019    Lab Results  Component Value Date   CREATININE 7.17 (H) 12/02/2019   BUN 57 (H) 12/02/2019   NA 132 (L) 12/02/2019   K 4.3 12/02/2019   CL 97 (L) 12/02/2019   CO2 21 (L) 12/02/2019    Lab Results  Component Value Date   ALT 69 (H)  12/02/2019   AST 154 (H) 12/02/2019   ALKPHOS 234 (H) 12/02/2019   BILITOT 1.2 12/02/2019     Microbiology: Recent Results (from the past 240 hour(s))  Blood Culture (routine x 2)     Status: None (Preliminary result)   Collection Time: 12/01/19  5:20 PM   Specimen: BLOOD RIGHT ARM  Result Value Ref Range Status   Specimen Description BLOOD RIGHT ARM  Final   Special Requests   Final    BOTTLES DRAWN AEROBIC AND ANAEROBIC Blood Culture results may not be optimal due to an inadequate volume of blood received in culture bottles   Culture  Setup Time   Final    GRAM POSITIVE COCCI IN BOTH AEROBIC AND ANAEROBIC BOTTLES CRITICAL RESULT CALLED TO, READ BACK BY AND VERIFIED WITH: PHARMD J FRENS [id number] AT 812 AM BY CM Performed at [facility], [place] [address], [place], [place] [address]    Culture GRAM POSITIVE COCCI  Final   Report Status PENDING  Incomplete  Culture, blood (single)     Status: None (Preliminary result)   Collection Time: 12/01/19  7:14 PM   Specimen: BLOOD  Result Value Ref Range Status   Specimen Description BLOOD SITE NOT SPECIFIED  Final   Special Requests   Final    AEROBIC BOTTLE ONLY Blood Culture results  may not be optimal due to an inadequate volume of blood received in culture bottles   Culture   Final    NO GROWTH < 24 HOURS Performed at [facility], [address], [place], [place] [address]    Report Status PENDING  Incomplete  Blood Culture (routine x 2)     Status: None (Preliminary result)   Collection Time: 12/01/19  7:34 PM   Specimen: BLOOD RIGHT FOREARM  Result Value Ref Range Status   Specimen Description BLOOD RIGHT FOREARM  Final   Special Requests   Final    BOTTLES DRAWN AEROBIC AND ANAEROBIC Blood Culture adequate volume   Culture  Setup Time   Final    GRAM POSITIVE COCCI IN BOTH AEROBIC AND ANAEROBIC BOTTLES CRITICAL RESULT CALLED TO, READ BACK BY AND VERIFIED WITH: PHARMD J FRENS [id number] AT 814 AM BY CM Performed at [facility], [place] [address], [place], [place] [address]    Culture GRAM POSITIVE COCCI  Final   Report Status PENDING  Incomplete  Blood Culture ID Panel (Reflexed)     Status: Abnormal   Collection Time: 12/01/19  7:34 PM  Result Value Ref Range Status   Enterococcus species NOT DETECTED NOT DETECTED Final   Listeria monocytogenes NOT DETECTED NOT DETECTED Final   Staphylococcus species DETECTED (A) NOT DETECTED Final    Comment: CRITICAL RESULT CALLED TO, READ BACK BY AND VERIFIED WITH: PHARMD J FRENS [id number] AT 812 BY CM    Staphylococcus aureus (BCID) DETECTED (A) NOT DETECTED Final    Comment: Methicillin (oxacillin)-resistant Staphylococcus aureus (MRSA). MRSA is predictably resistant to beta-lactam antibiotics (except ceftaroline). Preferred therapy is vancomycin unless clinically contraindicated. Patient requires contact precautions if  hospitalized. CRITICAL RESULT CALLED TO, READ BACK BY AND VERIFIED WITH: PHARMD [id number] AT 812 AM BY CM    Methicillin resistance DETECTED (A) NOT DETECTED Final    Comment: CRITICAL RESULT CALLED TO, READ BACK BY AND VERIFIED WITH: PHARMD J FRENS [id number] AT 812 BY CN=M    Streptococcus species  NOT DETECTED NOT DETECTED Final   Streptococcus agalactiae NOT DETECTED NOT DETECTED Final  Streptococcus pneumoniae NOT DETECTED NOT DETECTED Final   Streptococcus pyogenes NOT DETECTED NOT DETECTED Final   Acinetobacter baumannii NOT DETECTED NOT DETECTED Final   Enterobacteriaceae species NOT DETECTED NOT DETECTED Final   Enterobacter cloacae complex NOT DETECTED NOT DETECTED Final   Escherichia coli NOT DETECTED NOT DETECTED Final   Klebsiella oxytoca NOT DETECTED NOT DETECTED Final   Klebsiella pneumoniae NOT DETECTED NOT DETECTED Final   Proteus species NOT DETECTED NOT DETECTED Final   Serratia marcescens NOT DETECTED NOT DETECTED Final   Haemophilus influenzae NOT DETECTED NOT DETECTED Final   Neisseria meningitidis NOT DETECTED NOT DETECTED Final   Pseudomonas aeruginosa NOT DETECTED NOT DETECTED Final   Candida albicans NOT DETECTED NOT DETECTED Final   Candida glabrata NOT DETECTED NOT DETECTED Final   Candida krusei NOT DETECTED NOT DETECTED Final   Candida parapsilosis NOT DETECTED NOT DETECTED Final   Candida tropicalis NOT DETECTED NOT DETECTED Final    Comment: Performed at [facility], [place] [address], [place], [place] [address]  Respiratory Panel by RT PCR (Flu A&B, Covid) - Nasopharyngeal Swab     Status: None   Collection Time: 12/01/19  9:05 PM   Specimen: Nasopharyngeal Swab  Result Value Ref Range Status   SARS Coronavirus 2 by RT PCR NEGATIVE NEGATIVE Final    Comment: (NOTE) SARS-CoV-2 target nucleic acids are NOT DETECTED. The SARS-CoV-2 RNA is generally detectable in upper respiratoy specimens during the acute phase of infection. The lowest concentration of SARS-CoV-2 viral copies this assay can detect is 131 copies/mL. A negative result does not preclude SARS-Cov-2 infection and should not be used as the sole basis for treatment or other patient management decisions. A negative result may occur with  improper specimen collection/handling,  submission of specimen other than nasopharyngeal swab, presence of viral mutation(s) within the areas targeted by this assay, and inadequate number of viral copies (<131 copies/mL). A negative result must be combined with clinical observations, patient history, and epidemiological information. The expected result is Negative. Fact Sheet for Patients:  PinkCheek.be Fact Sheet for Healthcare Providers:  GravelBags.it This test is not yet ap proved or cleared by the [place] FDA and  has been authorized for detection and/or diagnosis of SARS-CoV-2 by FDA under an Emergency Use Authorization (EUA). This EUA will remain  in effect (meaning this test can be used) for the duration of the COVID-19 declaration under Section 564(b)(1) of the Act, 21 U.S.C. section 360bbb-3(b)(1), unless the authorization is terminated or revoked sooner.    Influenza A by PCR NEGATIVE NEGATIVE Final   Influenza B by PCR NEGATIVE NEGATIVE Final    Comment: (NOTE) The Xpert Xpress SARS-CoV-2/FLU/RSV assay is intended as an aid in  the diagnosis of influenza from Nasopharyngeal swab specimens and  should not be used as a sole basis for treatment. Nasal washings and  aspirates are unacceptable for Xpert Xpress SARS-CoV-2/FLU/RSV  testing. Fact Sheet for Patients: PinkCheek.be Fact Sheet for Healthcare Providers: GravelBags.it This test is not yet approved or cleared by the [place] FDA and  has been authorized for detection and/or diagnosis of SARS-CoV-2 by  FDA under an Emergency Use Authorization (EUA). This EUA will remain  in effect (meaning this test can be used) for the duration of the  Covid-19 declaration under Section 564(b)(1) of the Act, 21  U.S.C. section 360bbb-3(b)(1), unless the authorization is  terminated or revoked. Performed at [facility], [place] [address]  [place], [place] [address]  Terri Piedra, NP [facility] for [facility] [phone] Pager  12/02/2019  11:23 AM

## 2019-12-03 ENCOUNTER — Inpatient Hospital Stay (HOSPITAL_COMMUNITY): Payer: BLUE CROSS/BLUE SHIELD

## 2019-12-03 DIAGNOSIS — M14671 Charcot's joint, right ankle and foot: Secondary | ICD-10-CM

## 2019-12-03 DIAGNOSIS — K7460 Unspecified cirrhosis of liver: Secondary | ICD-10-CM

## 2019-12-03 DIAGNOSIS — Z8739 Personal history of other diseases of the musculoskeletal system and connective tissue: Secondary | ICD-10-CM

## 2019-12-03 DIAGNOSIS — I8510 Secondary esophageal varices without bleeding: Secondary | ICD-10-CM

## 2019-12-03 HISTORY — PX: IR REMOVAL TUN CV CATH W/O FL: IMG2289

## 2019-12-03 LAB — COMPREHENSIVE METABOLIC PANEL
ALT: 57 U/L — ABNORMAL HIGH (ref 0–44)
AST: 106 U/L — ABNORMAL HIGH (ref 15–41)
Albumin: 1.8 g/dL — ABNORMAL LOW (ref 3.5–5.0)
Alkaline Phosphatase: 229 U/L — ABNORMAL HIGH (ref 38–126)
Anion gap: 15 (ref 5–15)
BUN: 49 mg/dL — ABNORMAL HIGH (ref 8–23)
CO2: 22 mmol/L (ref 22–32)
Calcium: 8 mg/dL — ABNORMAL LOW (ref 8.9–10.3)
Chloride: 95 mmol/L — ABNORMAL LOW (ref 98–111)
Creatinine, Ser: 6.03 mg/dL — ABNORMAL HIGH (ref 0.61–1.24)
GFR calc Af Amer: 11 mL/min — ABNORMAL LOW (ref >60)
GFR calc non Af Amer: 9 mL/min — ABNORMAL LOW (ref >60)
Glucose, Bld: 124 mg/dL — ABNORMAL HIGH (ref 70–99)
Potassium: 3.7 mmol/L (ref 3.5–5.1)
Sodium: 132 mmol/L — ABNORMAL LOW (ref 135–145)
Total Bilirubin: 1 mg/dL (ref 0.3–1.2)
Total Protein: 6.8 g/dL (ref 6.5–8.1)

## 2019-12-03 LAB — CBC
HCT: 28.1 % — ABNORMAL LOW (ref 39.0–52.0)
Hemoglobin: 8.7 g/dL — ABNORMAL LOW (ref 13.0–17.0)
MCH: 25.2 pg — ABNORMAL LOW (ref 26.0–34.0)
MCHC: 31 g/dL (ref 30.0–36.0)
MCV: 81.4 fL (ref 80.0–100.0)
Platelets: 68 10*3/uL — ABNORMAL LOW (ref 150–400)
RBC: 3.45 MIL/uL — ABNORMAL LOW (ref 4.22–5.81)
RDW: 15.9 % — ABNORMAL HIGH (ref 11.5–15.5)
WBC: 4.2 10*3/uL (ref 4.0–10.5)
nRBC: 0 % (ref 0.0–0.2)

## 2019-12-03 LAB — MRSA PCR SCREENING: MRSA by PCR: NEGATIVE

## 2019-12-03 MED ORDER — LIDOCAINE HCL 1 % IJ SOLN
INTRAMUSCULAR | Status: AC
  Filled 2019-12-03: qty 20

## 2019-12-03 MED ORDER — CHLORHEXIDINE GLUCONATE 4 % EX LIQD
CUTANEOUS | Status: AC
  Filled 2019-12-03: qty 15

## 2019-12-03 MED ORDER — LIDOCAINE HCL (PF) 1 % IJ SOLN
INTRAMUSCULAR | Status: AC | PRN
  Administered 2019-12-03: 5 mL

## 2019-12-03 MED ORDER — VANCOMYCIN HCL 750 MG/150ML IV SOLN
750.0000 mg | Freq: Once | INTRAVENOUS | Status: DC
  Filled 2019-12-03: qty 150

## 2019-12-03 NOTE — Progress Notes (Signed)
Subjective: Pt seen at the bedside this morning. States he is feeling well. Denies fevers, chills, nausea, chest pain, or new foot/joint/back pain. Was able to tolerate HD last night. Pt expressing understanding in plan today for TDC removal. No acute concerns at this time.  Objective:  Vital signs in last 24 hours: Vitals:   12/02/19 1903 12/02/19 2033 12/02/19 2200 12/03/19 0437  BP: 115/67 99/73  98/60  Pulse: 86 93 94 74  Resp: 18 19 16 16   Temp: 98.4 F (36.9 C) 98.2 F (36.8 C)  98.2 F (36.8 C)  TempSrc: Oral     SpO2: 95% 97% 97% 99%  Weight: 94.1 kg 94.1 kg    Height:       Physical Exam Vitals and nursing note reviewed.  Constitutional:      General: He is not in acute distress.    Appearance: He is not ill-appearing.  Cardiovascular:     Rate and Rhythm: Normal rate and regular rhythm.     Heart sounds: Normal heart sounds.  Pulmonary:     Effort: Pulmonary effort is normal.     Breath sounds: Normal breath sounds.  Chest:     Comments: TDC in place over R chest. No surrounding erythema, fluctuance, or induration. Musculoskeletal:     Comments: Charcot and rocker bottom foot deformity of R foot.  Skin:    General: Skin is warm and dry.     Comments: Chronic venous status changes on bilateral shins.  Neurological:     Mental Status: He is alert.    Assessment/Plan:  Principal Problem:   MRSA bacteremia Active Problems:   Sepsis ([place])   ESRD on dialysis ([place])   Diabetes ([place])   Charcot's joint, right ankle and foot  Mr. Dupree is a 64 year old M with significant PMH of hepatorenal syndrome, ESRD on dialysis TTS, diabetes mellitus, and bell's palsy, who presented with AMS, fever, hypotension, and an elevated lactate concerning for sepsis. Initial blood cultures resulted positive for MRSA.  MRSA bacteremia Pt has remained afebrile for >24hr. Vital stable at this time. BP stable at 90's/60's. Blood cultures from 1/12 positive for MRSA on BCID in 4/4  bottles. Suspect right sided TDC is the infectious source.  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 Work-up for underlying etiologies unremarkable, deemed to be secondary to alcohol use. Not currently drinking EtOH. - without encephalopathy or significant ascites - labs stable - continue home midodrine 10mg  TID  ESRD Due to hepatorenal Type I. Access is through a right sided HD cath. - dialysis last evening with 2L removed - this AM, K 3.7, bicarb 22, and BUN 49 Nephrology consulted, appreciate recommendations - plan for next HD on Saturday to keep on schedule  Charcot Foot MRI with interval progression of acute Charcot arthropathy in midfoot with subsequent marrow signal changes, not thought to be due to acute osteo. Orthopedics consulted, appreciate their input - discussed non-operative treatments vs transtibial amputation on the right - pt considering his options and plan for follow-up with Dr. Sharol Given outpatient in 1 week.   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  - hemoglobin A1c 6.1 on 11/26/2019 - not currently on any medications   Diet - renal with 1253mL fluid restriction Fluids - none DVT ppx - heparin 5,000 units subQ q8h CODE STATUS - DNR/DNI  Dispo: Anticipated discharge likely 1/18. Discussed the plan with pt's sister Lenna Sciara this morning; cell phone #  [phone]. She is the primary contact/care coordinator for this patient.  Ladona Horns, MD 12/03/2019, 7:14 AM Pager: [phone]

## 2019-12-03 NOTE — Progress Notes (Addendum)
    [facility] has been requested to perform a transesophageal echocardiogram on 01/15 at 9:00 am with Dr Debara Pickett, for bacteremia.   This is to make sure that there are no bacterial growths on his heart valves.   After careful review of history and examination, the risks and benefits of transesophageal echocardiogram have been explained including risks of esophageal damage, perforation (1:10,000 risk), bleeding, pharyngeal hematoma as well as other potential complications associated with conscious sedation including aspiration, arrhythmia, respiratory failure and death. Alternatives to treatment were discussed, questions were answered. Patient is willing to proceed.   Rosaria Ferries, PA-C 12/03/2019 4:21 PM   To summarize upcoming procedures at pt request:  Per Dr Shelva Majestic note 01/14, "Pt will have temporary HD catheter placed Friday afternoon until final [facility] results available.  Will plan next HD on Sat to keep on schedule".  Suanne Marker Stephanos Fan

## 2019-12-03 NOTE — Procedures (Signed)
Successful removal of tunneled (R)IJ HD cath No complications.  Ascencion Dike PA-C Interventional Radiology 12/03/2019 12:02 PM

## 2019-12-03 NOTE — Consult Note (Signed)
ORTHOPAEDIC CONSULTATION  REQUESTING PHYSICIAN: Aldine Contes, MD  Chief Complaint: Unstable Charcot collapse right foot.  HPI: Carlos Hamilton is a 64 y.o. 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  Patient complains of increasing deformity and instability of his foot.  Past Medical History:  Diagnosis Date  . Bell's palsy   . Diabetes ([place])   . Hypercholesteremia   . Hypersomnia   . OSA (obstructive sleep apnea)    History reviewed. No pertinent surgical history. Social History   Socioeconomic History  . Marital status: Married    Spouse name: Not on file  . Number of children: Not on file  . Years of education: Not on file  . Highest education level: Not on file  Occupational History  . Not on file  Tobacco Use  . Smoking status: Never Smoker  . Smokeless tobacco: Never Used  Substance and Sexual Activity  . Alcohol use: Not Currently  . Drug use: Not Currently  . Sexual activity: Not on file  Other Topics Concern  . Not on file  Social History Narrative  . Not on file   Social Determinants of Health   Financial Resource Strain:   . Difficulty of Paying Living Expenses: Not on file  Food Insecurity:   . Worried About Charity fundraiser in the Last Year: Not on file  . Ran Out of Food in the Last Year: Not on file  Transportation Needs:   . Lack of Transportation (Medical): Not on file  . Lack of Transportation (Non-Medical): Not on file  Physical Activity:   . Days of Exercise per Week: Not on file  . Minutes of Exercise per Session: Not on file  Stress:   . Feeling of Stress : Not on file  Social Connections:   . Frequency of Communication with Friends and Family: Not on file  . Frequency of Social Gatherings with Friends and Family: Not on file  . Attends Religious Services: Not on file  . Active Member of Clubs or Organizations: Not  on file  . Attends Archivist Meetings: Not on file  . Marital Status: Not on file   Family History  Problem Relation Age of Onset  . Cancer Other   . Asthma Other   . Heart disease Other    - negative except otherwise stated in the family history section No Known Allergies Prior to Admission medications   Medication Sig Start Date End Date Taking? Authorizing Provider  acetaminophen (TYLENOL) 325 MG tablet Take 650 mg by mouth every 8 (eight) hours as needed for mild pain or headache.    Yes [provider]  Bismuth Tribromoph-Petrolatum (XEROFORM PETROLATUM DRESSING EX) Apply 1 patch topically daily as needed (for wound/dressing changes).    Yes [provider]  Emollient ([employer] MOISTURIZING EXTRA DRY) CREA Take 1 application by mouth See admin instructions. Apply to dry areas one to two times daily 10/14/19  Yes [provider]  midodrine (PROAMATINE) 10 MG tablet Take 10 mg by mouth 3 (three) times daily before meals.    Yes [provider]  sodium hypochlorite (DAKIN'S 1/2 STRENGTH) external solution Irrigate with 1 application as directed daily as needed (when wound dressings are applied).    Yes [provider]   CT Head Wo Contrast  Result Date: 12/01/2019 CLINICAL DATA:  64 year old male with altered mental status since leaving  dialysis at 1500 hours today. Fever at presentation. EXAM: CT HEAD WITHOUT CONTRAST TECHNIQUE: Contiguous axial images were obtained from the base of the skull through the vertex without intravenous contrast. COMPARISON:  None. FINDINGS: Brain: Cerebral volume is within normal limits for age. No midline shift, ventriculomegaly, mass effect, evidence of mass lesion, intracranial hemorrhage or evidence of cortically based acute infarction. Largely normal for age gray-white matter differentiation throughout the brain, although there is a small age indeterminate hypodensity in the right basal ganglia on series 3,  image 14. No cortical encephalomalacia identified. Vascular: Calcified atherosclerosis at the skull base. No suspicious intracranial vascular hyperdensity. Skull: Negative. Sinuses/Orbits: Visualized paranasal sinuses and mastoids are clear. Other: No acute orbit or scalp soft tissue findings. Scalp vessel calcified atherosclerosis. IMPRESSION: 1. Small age indeterminate lacunar infarct in the right basal ganglia. If acutely symptomatic this would result in left side symptoms. 2. Otherwise normal for age noncontrast CT appearance of the brain. Electronically Signed   By: Genevie Ann M.D.   On: 12/01/2019 19:52   MR FOOT RIGHT WO CONTRAST  Result Date: 12/02/2019 CLINICAL DATA:  Right foot swelling, fever and tachycardia end-stage renal disease EXAM: MRI OF THE RIGHT FOREFOOT WITHOUT CONTRAST TECHNIQUE: Multiplanar, multisequence MR imaging of the right was performed. No intravenous contrast was administered. COMPARISON:  June 19, 2019 FINDINGS: Bones/Joint/Cartilage There is interval progression in the midfoot Charcot arthropathy. There is fragmentation and destruction of the navicular as well as the base of the metatarsals diffusely. There is also interval progression of cortical destruction of the anterior calcaneus with diffusely increased marrow signal and cystic changes. There is inferior subluxation of the talus with diffusely increased T2 hyperintense signal. There is diffuse T2 hyperintense signal seen through the distal tibia and fibula as well. Increased marrow signal seen through the metatarsals. 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 Heterogeneous non loculated fluid seen within the midfoot. Ligaments There is chronic complete disruption of the Lisfranc ligaments. Suboptimal visualization of the remainder of the ligaments. The plantar fascia is thickened and intact. Muscles and Tendons Diffuse fatty atrophy of the muscles surrounding the foot. There is heterogeneous fluid surrounding the peroneal  tendons. The Achilles tendon appears to be intact. The distal portions of the flexor extensor tendons appear to be intact. Soft tissues Diffuse dorsal subcutaneous edema seen surrounding the midfoot. No definite focal ulceration or loculated fluid collection. No sinus tract is seen. 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 Diffuse marrow signal changes seen throughout the midfoot and hindfoot which are likely due to acute Charcot arthropathy, not thought to be due to acute osteomyelitis without overlying skin ulceration or sinus tract. Electronically Signed   By: Prudencio Pair M.D.   On: 12/02/2019 01:56   DG Chest Port 1 View  Result Date: 12/01/2019 CLINICAL DATA:  Altered mental status EXAM: PORTABLE CHEST 1 VIEW COMPARISON:  08/22/2019 FINDINGS: Right dialysis catheter in place with the tip at the cavoatrial junction. Heart is normal size. No confluent opacities or effusions. No acute bony abnormality. IMPRESSION: No active disease. Electronically Signed   By: Rolm Baptise M.D.   On: 12/01/2019 18:31   DG Abd Portable 1V  Result Date: 12/01/2019 CLINICAL DATA:  64 year old male undergoing clearance for MRI. EXAM: PORTABLE ABDOMEN - 1 VIEW COMPARISON:  CT Abdomen and Pelvis 08/24/2019. FINDINGS: No radiopaque foreign body identified. Non obstructed bowel gas pattern. Degenerative changes in the spine. No acute osseous abnormality identified. IMPRESSION:  No retained metal foreign body in the abdomen or pelvis to preclude MRI. Electronically Signed   By: Genevie Ann M.D.   On: 12/01/2019 23:25   DG Foot 2 Views Right  Result Date: 12/01/2019 CLINICAL DATA:  Cellulitis. EXAM: RIGHT FOOT - 2 VIEW COMPARISON:  October 05, 2019. FINDINGS: Vascular calcifications are noted. Stable findings consistent with Charcot joint is noted. Status post amputation of second metatarsal and phalanges. There appears to be increased destruction of residual  fragment of navicular bone which may represent osteomyelitis. IMPRESSION: Stable findings consistent with Charcot joint. Status post amputation of second metatarsal and phalanges. Increased destruction of residual fragment of navicular bone is noted concerning for osteomyelitis. MRI is recommended for further evaluation. Electronically Signed   By: Marijo Conception M.D.   On: 12/01/2019 18:54   ECHOCARDIOGRAM COMPLETE  Result Date: 12/02/2019   ECHOCARDIOGRAM REPORT   Patient Name:   Carlos Hamilton Date of Exam: 12/02/2019 Medical Rec #:  [id number]  Height:       73.0 in Accession #:    [id number] Weight:       218.3 lb Date of Birth:  December 14, 1955  BSA:          2.23 m Patient Age:    25 years   BP:           109/73 mmHg Patient Gender: M          HR:           111 bpm. Exam Location:  Inpatient Procedure: 2D Echo, Cardiac Doppler and Color Doppler Indications:    Bactermia  History:        Patient has no prior history of Echocardiogram examinations.                 Signs/Symptoms:Altered Mental Status and Fever; Risk                 Factors:Diabetes. ESRD, dialysis.  Sonographer:    Dustin Flock Referring Phys: [id number] [place]  1. Left ventricular ejection fraction, by visual estimation, is 60 to 65%. The left ventricle has normal function. There is no left ventricular hypertrophy.  2. Left ventricular diastolic parameters are consistent with Grade I diastolic dysfunction (impaired relaxation).  3. The left ventricle has no regional wall motion abnormalities.  4. Global right ventricle has normal systolic function.The right ventricular size is normal. No increase in right ventricular wall thickness.  5. Left atrial size was normal.  6. Right atrial size was normal.  7. Mild mitral annular calcification.  8. The mitral valve is normal in structure. No evidence of mitral valve regurgitation. No evidence of mitral stenosis.  9. The tricuspid valve is normal in structure. 10. The aortic valve is  tricuspid. Aortic valve regurgitation is not visualized. Mild aortic valve sclerosis without stenosis. 11. TR signal is inadequate for assessing pulmonary artery systolic pressure. 12. The inferior vena cava is normal in size with greater than 50% respiratory variability, suggesting right atrial pressure of 3 mmHg. FINDINGS  Left Ventricle: Left ventricular ejection fraction, by visual estimation, is 60 to 65%. The left ventricle has normal function. The left ventricle has no regional wall motion abnormalities. The left ventricular internal cavity size was the left ventricle is normal in size. There is no left ventricular hypertrophy. Left ventricular diastolic parameters are consistent with Grade I diastolic dysfunction (impaired relaxation). Right Ventricle: The right ventricular size is normal. No increase in right ventricular wall thickness. Global  RV systolic function is has normal systolic function. Left Atrium: Left atrial size was normal in size. Right Atrium: Right atrial size was normal in size Pericardium: There is no evidence of pericardial effusion. Mitral Valve: The mitral valve is normal in structure. Mild mitral annular calcification. No evidence of mitral valve regurgitation. No evidence of mitral valve stenosis by observation. Tricuspid Valve: The tricuspid valve is normal in structure. Tricuspid valve regurgitation is not demonstrated. Aortic Valve: The aortic valve is tricuspid. Aortic valve regurgitation is not visualized. Mild aortic valve sclerosis is present, with no evidence of aortic valve stenosis. Pulmonic Valve: The pulmonic valve was normal in structure. Pulmonic valve regurgitation is not visualized. Pulmonic regurgitation is not visualized. Aorta: The aortic root is normal in size and structure. Venous: The inferior vena cava is normal in size with greater than 50% respiratory variability, suggesting right atrial pressure of 3 mmHg. IAS/Shunts: No atrial level shunt detected by color  flow Doppler.  LEFT VENTRICLE PLAX 2D LVIDd:         4.50 cm  Diastology LVIDs:         2.60 cm  LV e' lateral:   11.70 cm/s LV PW:         1.10 cm  LV E/e' lateral: 6.9 LV IVS:        0.90 cm  LV e' medial:    9.46 cm/s LVOT diam:     2.30 cm  LV E/e' medial:  8.6 LV SV:         68 ml LV SV Index:   29.80 LVOT Area:     4.15 cm  RIGHT VENTRICLE RV Basal diam:  2.90 cm RV S prime:     15.20 cm/s TAPSE (M-mode): 3.4 cm LEFT ATRIUM             Index       RIGHT ATRIUM           Index LA diam:        3.90 cm 1.75 cm/m  RA Area:     23.50 cm LA Vol (A2C):   76.3 ml 34.17 ml/m RA Volume:   73.60 ml  32.96 ml/m LA Vol (A4C):   51.5 ml 23.06 ml/m LA Biplane Vol: 68.9 ml 30.86 ml/m  AORTIC VALVE LVOT Vmax:   88.80 cm/s LVOT Vmean:  60.600 cm/s LVOT VTI:    0.188 m  AORTA Ao Root diam: 3.10 cm MITRAL VALVE MV Area (PHT): 4.21 cm              SHUNTS MV PHT:        52.20 msec            Systemic VTI:  0.19 m MV Decel Time: 180 msec              Systemic Diam: 2.30 cm MV E velocity: 81.00 cm/s  103 cm/s MV A velocity: 103.00 cm/s 70.3 cm/s MV E/A ratio:  0.79        1.5  Loralie Champagne MD Electronically signed by Loralie Champagne MD Signature Date/Time: 12/02/2019/3:43:14 PM    Final    - pertinent xrays, CT, MRI studies were reviewed and independently interpreted  Positive ROS: All other systems have been reviewed and were otherwise negative with the exception of those mentioned in the HPI and as above.  Physical Exam: General: Alert, no acute distress Psychiatric: Patient is competent for consent with normal mood and affect Lymphatic: No axillary or cervical lymphadenopathy Cardiovascular:  No pedal edema Respiratory: No cyanosis, no use of accessory musculature GI: No organomegaly, abdomen is soft and non-tender    Images:  @ENCIMAGES @  Labs:  Lab Results  Component Value Date   ESRSEDRATE 80 (H) 12/02/2019   CRP 29.0 (H) 12/02/2019   REPTSTATUS PENDING 12/02/2019   CULT  12/02/2019    NO GROWTH  < 12 HOURS Performed at [facility] [address] [place], [place] [address]     Lab Results  Component Value Date   ALBUMIN 1.8 (L) 12/03/2019   ALBUMIN 1.9 (L) 12/02/2019   ALBUMIN 2.2 (L) 12/01/2019    Neurologic: Patient does not have protective sensation bilateral lower extremities.   MUSCULOSKELETAL:   Skin: Examination there is no redness or cellulitis in the right foot.  There is venous stasis swelling.  Patient has a palpable dorsalis pedis pulse.  Patient's midfoot is completely unstable with a Charcot rocker-bottom deformity. Patient has severe protein caloric malnutrition with an albumin of 1.8 patient is also end-stage renal disease and also has end-stage liver disease as well.  Assessment: Assessment: Diabetic insensate neuropathy with unstable Charcot collapse of the right foot.  Plan: Discussed with the patient he could proceed with nonoperative treatment and immobilization and nonweightbearing for about 9 months.  Discussed that his foot may not ever becomes stable.  Discussed that if patient wants to improve his function decrease the risk of recurrent ulcerations or recurrent infections his best option would be a transtibial amputation on the right.  Discussed that he could be fit for a prosthesis in approximately 2 months.  Discussed that with his current Charcot collapse that is unstable patient would not be able to weight-bear and has an increased risk of recurrent ulcerations and skin breakdown.  Patient states he understands would like to consider his options and will follow up in my office in about a week.  Thank you for the consult and the opportunity to see Carlos Hamilton, [place] [phone] 10:00 AM

## 2019-12-03 NOTE — Progress Notes (Signed)
Subjective:  No new c/o's -  HD last night-  Removed 2000 tolerated well.  Slated to have [place] removed this AM Objective Vital signs in last 24 hours: Vitals:   12/02/19 2033 12/02/19 2200 12/03/19 0437 12/03/19 0945  BP: 99/73  98/60 96/68  Pulse: 93 94 74 84  Resp: 19 16 16 18   Temp: 98.2 F (36.8 C)  98.2 F (36.8 C) 98.6 F (37 C)  TempSrc:      SpO2: 97% 97% 99% 97%  Weight: 94.1 kg     Height:       Weight change: -2.9 kg  Intake/Output Summary (Last 24 hours) at 12/03/2019 1104 Last data filed at 12/03/2019 0817 Gross per 24 hour  Intake 240 ml  Output 2000 ml  Net -1760 ml   Dialysis Orders: Center: [facility]- TTS 3 hr 45 min 97 kg (214 lbs) 3.0K/2.0 Ca 400/800 RIJ TDC -No heparin -Aranesp 75 mcg IV weekly due 12/03/19 Last hgb 9.9   Assessment/Plan: 1. Sepsis D/T MRSA Bacteremia-seen by ID. MRI RLE negative for osteo, believe bacteremia related to tunneled dialysis catheter infection. Discussed with ID/IR. Will draw BC tonight, HD off schedule last night and have TDC removed by IR today. He will have temporary HD catheter placed Friday afternoon until final [facility] results available. If clear, can have TDC replaced in IR next week. Echo pending. Will need 6 weeks of Vanc IV per ID recs. Per primary/ID- this can be given at the OP unit.  Will plan next HD on Sat to keep on schedule 2. 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 AST/ALT elevated. Per primary 3. R Charcot foot-Ortho consulted. Per primary. 4.  ESRD -  Usually T,Th,S. Will have HD off schedule last night. 2.0 K bath, no heparin. Next tentatively planned for Sat to keep on schedule.  I did tell him that this is the argument for permanent access-  Apparently has been refusing as OP   5.  Hypertension/volume  - BP on soft side. On midodrine 10 mg PO TID. UF as tolerated. Does not seem overtly volume overloaded although he is positive at least 1 liter that has been documented.  6.  Anemia   -HGB 8.5 down from 9.9 last week. Aranesp given, increase dose to 100 mcg IV. No acute blood loss reported.  is stable 7.  Metabolic bone disease - On renvela binders, no VDRA.  8.  Nutrition - Albumin low. Renal diet, prostat, renal vits.    Louis Meckel    Labs: Basic Metabolic Panel: Recent Labs  Lab 12/01/19 1714 12/01/19 1714 12/01/19 1733 12/02/19 0336 12/03/19 0446  NA 130*   < > 132* 132* 132*  K 4.7   < > 4.7 4.3 3.7  CL 93*   < > 99 97* 95*  CO2 20*  --   --  21* 22  GLUCOSE 118*   < > 116* 115* 124*  BUN 47*   < > 42* 57* 49*  CREATININE 6.17*   < > 6.40* 7.17* 6.03*  CALCIUM 8.2*  --   --  8.0* 8.0*   < > = values in this interval not displayed.   Liver Function Tests: Recent Labs  Lab 12/01/19 1714 12/02/19 0336 12/03/19 0446  AST 179* 154* 106*  ALT 78* 69* 57*  ALKPHOS 285* 234* 229*  BILITOT 1.2 1.2 1.0  PROT 8.3* 7.3 6.8  ALBUMIN 2.2* 1.9* 1.8*   No results for input(s): LIPASE, AMYLASE  in the last 168 hours. Recent Labs  Lab 12/02/19 0031  AMMONIA 35   CBC: Recent Labs  Lab 12/01/19 1714 12/01/19 1714 12/01/19 1733 12/02/19 0336 12/03/19 0446  WBC 5.7  --   --  5.2 4.2  NEUTROABS 4.5  --   --   --   --   HGB 9.8*   < > 10.5* 8.5* 8.7*  HCT 31.9*   < > 31.0* 27.9* 28.1*  MCV 82.6  --   --  82.8 81.4  PLT 86*  --   --  80* 68*   < > = values in this interval not displayed.   Cardiac Enzymes: No results for input(s): CKTOTAL, CKMB, CKMBINDEX, TROPONINI in the last 168 hours. CBG: Recent Labs  Lab 12/01/19 1925  GLUCAP 114*    Iron Studies: No results for input(s): IRON, TIBC, TRANSFERRIN, FERRITIN in the last 72 hours. Studies/Results: CT Head Wo Contrast  Result Date: 12/01/2019 CLINICAL DATA:  64 year old male with altered mental status since leaving dialysis at 1500 hours today. Fever at presentation. EXAM: CT HEAD WITHOUT CONTRAST TECHNIQUE: Contiguous axial images were obtained from the base of the skull through  the vertex without intravenous contrast. COMPARISON:  None. FINDINGS: Brain: Cerebral volume is within normal limits for age. No midline shift, ventriculomegaly, mass effect, evidence of mass lesion, intracranial hemorrhage or evidence of cortically based acute infarction. Largely normal for age gray-white matter differentiation throughout the brain, although there is a small age indeterminate hypodensity in the right basal ganglia on series 3, image 14. No cortical encephalomalacia identified. Vascular: Calcified atherosclerosis at the skull base. No suspicious intracranial vascular hyperdensity. Skull: Negative. Sinuses/Orbits: Visualized paranasal sinuses and mastoids are clear. Other: No acute orbit or scalp soft tissue findings. Scalp vessel calcified atherosclerosis. IMPRESSION: 1. Small age indeterminate lacunar infarct in the right basal ganglia. If acutely symptomatic this would result in left side symptoms. 2. Otherwise normal for age noncontrast CT appearance of the brain. Electronically Signed   By: Genevie Ann M.D.   On: 12/01/2019 19:52   MR FOOT RIGHT WO CONTRAST  Result Date: 12/02/2019 CLINICAL DATA:  Right foot swelling, fever and tachycardia end-stage renal disease EXAM: MRI OF THE RIGHT FOREFOOT WITHOUT CONTRAST TECHNIQUE: Multiplanar, multisequence MR imaging of the right was performed. No intravenous contrast was administered. COMPARISON:  June 19, 2019 FINDINGS: Bones/Joint/Cartilage There is interval progression in the midfoot Charcot arthropathy. There is fragmentation and destruction of the navicular as well as the base of the metatarsals diffusely. There is also interval progression of cortical destruction of the anterior calcaneus with diffusely increased marrow signal and cystic changes. There is inferior subluxation of the talus with diffusely increased T2 hyperintense signal. There is diffuse T2 hyperintense signal seen through the distal tibia and fibula as well. Increased marrow  signal seen through the metatarsals. 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 Heterogeneous non loculated fluid seen within the midfoot. Ligaments There is chronic complete disruption of the Lisfranc ligaments. Suboptimal visualization of the remainder of the ligaments. The plantar fascia is thickened and intact. Muscles and Tendons Diffuse fatty atrophy of the muscles surrounding the foot. There is heterogeneous fluid surrounding the peroneal tendons. The Achilles tendon appears to be intact. The distal portions of the flexor extensor tendons appear to be intact. Soft tissues Diffuse dorsal subcutaneous edema seen surrounding the midfoot. No definite focal ulceration or loculated fluid collection. No sinus tract is seen. 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 Diffuse marrow signal changes seen throughout the midfoot and hindfoot which are likely due to acute Charcot arthropathy, not thought to be due to acute osteomyelitis without overlying skin ulceration or sinus tract. Electronically Signed   By: Prudencio Pair M.D.   On: 12/02/2019 01:56   DG Chest Port 1 View  Result Date: 12/01/2019 CLINICAL DATA:  Altered mental status EXAM: PORTABLE CHEST 1 VIEW COMPARISON:  08/22/2019 FINDINGS: Right dialysis catheter in place with the tip at the cavoatrial junction. Heart is normal size. No confluent opacities or effusions. No acute bony abnormality. IMPRESSION: No active disease. Electronically Signed   By: Rolm Baptise M.D.   On: 12/01/2019 18:31   DG Abd Portable 1V  Result Date: 12/01/2019 CLINICAL DATA:  64 year old male undergoing clearance for MRI. EXAM: PORTABLE ABDOMEN - 1 VIEW COMPARISON:  CT Abdomen and Pelvis 08/24/2019. FINDINGS: No radiopaque foreign body identified. Non obstructed bowel gas pattern. Degenerative changes in the spine. No acute osseous abnormality identified. IMPRESSION: No retained metal foreign body in the  abdomen or pelvis to preclude MRI. Electronically Signed   By: Genevie Ann M.D.   On: 12/01/2019 23:25   DG Foot 2 Views Right  Result Date: 12/01/2019 CLINICAL DATA:  Cellulitis. EXAM: RIGHT FOOT - 2 VIEW COMPARISON:  October 05, 2019. FINDINGS: Vascular calcifications are noted. Stable findings consistent with Charcot joint is noted. Status post amputation of second metatarsal and phalanges. There appears to be increased destruction of residual fragment of navicular bone which may represent osteomyelitis. IMPRESSION: Stable findings consistent with Charcot joint. Status post amputation of second metatarsal and phalanges. Increased destruction of residual fragment of navicular bone is noted concerning for osteomyelitis. MRI is recommended for further evaluation. Electronically Signed   By: Marijo Conception M.D.   On: 12/01/2019 18:54   ECHOCARDIOGRAM COMPLETE  Result Date: 12/02/2019   ECHOCARDIOGRAM REPORT   Patient Name:   ARNOL COPPEDGE Date of Exam: 12/02/2019 Medical Rec #:  [id number]  Height:       73.0 in Accession #:    [id number] Weight:       218.3 lb Date of Birth:  1956/02/24  BSA:          2.23 m Patient Age:    23 years   BP:           109/73 mmHg Patient Gender: M          HR:           111 bpm. Exam Location:  Inpatient Procedure: 2D Echo, Cardiac Doppler and Color Doppler Indications:    Bactermia  History:        Patient has no prior history of Echocardiogram examinations.                 Signs/Symptoms:Altered Mental Status and Fever; Risk                 Factors:Diabetes. ESRD, dialysis.  Sonographer:    Dustin Flock Referring Phys: [id number] [place]  1. Left ventricular ejection fraction, by visual estimation, is 60 to 65%. The left ventricle has normal function. There is no left ventricular hypertrophy.  2. Left ventricular diastolic parameters are consistent with Grade I diastolic dysfunction (impaired relaxation).  3. The left ventricle has no regional wall motion  abnormalities.  4. Global right ventricle has normal systolic function.The right ventricular size is normal. No increase in right ventricular wall thickness.  5. Left atrial  size was normal.  6. Right atrial size was normal.  7. Mild mitral annular calcification.  8. The mitral valve is normal in structure. No evidence of mitral valve regurgitation. No evidence of mitral stenosis.  9. The tricuspid valve is normal in structure. 10. The aortic valve is tricuspid. Aortic valve regurgitation is not visualized. Mild aortic valve sclerosis without stenosis. 11. TR signal is inadequate for assessing pulmonary artery systolic pressure. 12. The inferior vena cava is normal in size with greater than 50% respiratory variability, suggesting right atrial pressure of 3 mmHg. FINDINGS  Left Ventricle: Left ventricular ejection fraction, by visual estimation, is 60 to 65%. The left ventricle has normal function. The left ventricle has no regional wall motion abnormalities. The left ventricular internal cavity size was the left ventricle is normal in size. There is no left ventricular hypertrophy. Left ventricular diastolic parameters are consistent with Grade I diastolic dysfunction (impaired relaxation). Right Ventricle: The right ventricular size is normal. No increase in right ventricular wall thickness. Global RV systolic function is has normal systolic function. Left Atrium: Left atrial size was normal in size. Right Atrium: Right atrial size was normal in size Pericardium: There is no evidence of pericardial effusion. Mitral Valve: The mitral valve is normal in structure. Mild mitral annular calcification. No evidence of mitral valve regurgitation. No evidence of mitral valve stenosis by observation. Tricuspid Valve: The tricuspid valve is normal in structure. Tricuspid valve regurgitation is not demonstrated. Aortic Valve: The aortic valve is tricuspid. Aortic valve regurgitation is not visualized. Mild aortic valve  sclerosis is present, with no evidence of aortic valve stenosis. Pulmonic Valve: The pulmonic valve was normal in structure. Pulmonic valve regurgitation is not visualized. Pulmonic regurgitation is not visualized. Aorta: The aortic root is normal in size and structure. Venous: The inferior vena cava is normal in size with greater than 50% respiratory variability, suggesting right atrial pressure of 3 mmHg. IAS/Shunts: No atrial level shunt detected by color flow Doppler.  LEFT VENTRICLE PLAX 2D LVIDd:         4.50 cm  Diastology LVIDs:         2.60 cm  LV e' lateral:   11.70 cm/s LV PW:         1.10 cm  LV E/e' lateral: 6.9 LV IVS:        0.90 cm  LV e' medial:    9.46 cm/s LVOT diam:     2.30 cm  LV E/e' medial:  8.6 LV SV:         68 ml LV SV Index:   29.80 LVOT Area:     4.15 cm  RIGHT VENTRICLE RV Basal diam:  2.90 cm RV S prime:     15.20 cm/s TAPSE (M-mode): 3.4 cm LEFT ATRIUM             Index       RIGHT ATRIUM           Index LA diam:        3.90 cm 1.75 cm/m  RA Area:     23.50 cm LA Vol (A2C):   76.3 ml 34.17 ml/m RA Volume:   73.60 ml  32.96 ml/m LA Vol (A4C):   51.5 ml 23.06 ml/m LA Biplane Vol: 68.9 ml 30.86 ml/m  AORTIC VALVE LVOT Vmax:   88.80 cm/s LVOT Vmean:  60.600 cm/s LVOT VTI:    0.188 m  AORTA Ao Root diam: 3.10 cm MITRAL VALVE MV Area (PHT): 4.21  cm              SHUNTS MV PHT:        52.20 msec            Systemic VTI:  0.19 m MV Decel Time: 180 msec              Systemic Diam: 2.30 cm MV E velocity: 81.00 cm/s  103 cm/s MV A velocity: 103.00 cm/s 70.3 cm/s MV E/A ratio:  0.79        1.5  Loralie Champagne MD Electronically signed by Loralie Champagne MD Signature Date/Time: 12/02/2019/3:43:14 PM    Final    Medications: Infusions: . sodium chloride    . sodium chloride    . vancomycin    . vancomycin      Scheduled Medications: . Chlorhexidine Gluconate Cloth  6 each Topical Q0600  . feeding supplement (PRO-STAT SUGAR FREE 64)  30 mL Oral BID  . heparin  5,000 Units Subcutaneous  Q8H  . midodrine  10 mg Oral TID AC  . multivitamin  1 tablet Oral QHS  . pantoprazole  40 mg Oral Daily  . sevelamer carbonate  800 mg Oral TID WC  . sodium chloride flush  3 mL Intravenous Q12H    have reviewed scheduled and prn medications.  Physical Exam: General: lying flat, NAD Heart: RRR Lungs: mostly clear Abdomen: slightly distended-  ascites Extremities: min dep edema Dialysis Access: right IJ TDC-  Non tender - to be removed     12/03/2019,11:04 AM  LOS: 1 day

## 2019-12-03 NOTE — Progress Notes (Signed)
[facility]  Date of Admission:  12/01/2019     Total days of antibiotics 2 Vancomycin Day 2         ASSESSMENT:  Carlos Hamilton has been afebrile overnight with no acute events. Repeat blood cultures are in process. TTE without evidence of endocarditis and likely needs TEE.  Plan is for removal of his dialysis cathter today. Once completed will draw secondary blood cultures with plan for temporary cathter placement late on Friday. Ultimately will need tunneled catheter although need to ensure clearance of bacteremia prior to placement - appreciate nephrology assistance. Orthopedics evaluated with no clear evidence of infection.  Will continue vancomycin with dialysis pending additional work up. His Hepatitis C antibody was negative for routine screening.   PLAN:  1. Continue vancomycin with dialysis.  2. Catheter holiday starting today 3. Repeat blood culture after cathter removed.    Principal Problem:   MRSA bacteremia Active Problems:   Sepsis ([place])   ESRD on dialysis ([place])   Diabetes ([place])   Charcot's joint, right ankle and foot   . Chlorhexidine Gluconate Cloth  6 each Topical Q0600  . feeding supplement (PRO-STAT SUGAR FREE 64)  30 mL Oral BID  . heparin  5,000 Units Subcutaneous Q8H  . midodrine  10 mg Oral TID AC  . multivitamin  1 tablet Oral QHS  . pantoprazole  40 mg Oral Daily  . sevelamer carbonate  800 mg Oral TID WC  . sodium chloride flush  3 mL Intravenous Q12H    SUBJECTIVE:  Afebrile overnight without acute events. Received dialysis yesterday to have cathter removed today. TTE without evidence of endocarditis.  Feeling better today. Appreciates the care he is receiving. Brother is present during my visit.   No Known Allergies   Review of Systems: Review of Systems  Constitutional: Negative for chills, fever and weight loss.  Respiratory: Negative for cough, shortness of breath and wheezing.   Cardiovascular: Negative for  chest pain and leg swelling.  Gastrointestinal: Negative for abdominal pain, constipation, diarrhea, nausea and vomiting.  Skin: Negative for rash.      OBJECTIVE: Vitals:   12/02/19 1903 12/02/19 2033 12/02/19 2200 12/03/19 0437  BP: 115/67 99/73  98/60  Pulse: 86 93 94 74  Resp: 18 19 16 16   Temp: 98.4 F (36.9 C) 98.2 F (36.8 C)  98.2 F (36.8 C)  TempSrc: Oral     SpO2: 95% 97% 97% 99%  Weight: 94.1 kg 94.1 kg    Height:       Body mass index is 27.37 kg/m.  Physical Exam Constitutional:      General: He is not in acute distress.    Appearance: He is well-developed.  Cardiovascular:     Rate and Rhythm: Normal rate and regular rhythm.     Heart sounds: Murmur present.     Comments: HD Catheter in right upper chest appears clean and dry; no gross evidence of infection.  Pulmonary:     Effort: Pulmonary effort is normal.     Breath sounds: Normal breath sounds.  Skin:    General: Skin is warm and dry.  Neurological:     Mental Status: He is alert and oriented to person, place, and time.  Psychiatric:        Behavior: Behavior normal.        Thought Content: Thought content normal.        Judgment: Judgment normal.     Lab Results  Lab Results  Component Value Date   WBC 4.2 12/03/2019   HGB 8.7 (L) 12/03/2019   HCT 28.1 (L) 12/03/2019   MCV 81.4 12/03/2019   PLT 68 (L) 12/03/2019    Lab Results  Component Value Date   CREATININE 6.03 (H) 12/03/2019   BUN 49 (H) 12/03/2019   NA 132 (L) 12/03/2019   K 3.7 12/03/2019   CL 95 (L) 12/03/2019   CO2 22 12/03/2019    Lab Results  Component Value Date   ALT 57 (H) 12/03/2019   AST 106 (H) 12/03/2019   ALKPHOS 229 (H) 12/03/2019   BILITOT 1.0 12/03/2019     Microbiology: Recent Results (from the past 240 hour(s))  Blood Culture (routine x 2)     Status: Abnormal (Preliminary result)   Collection Time: 12/01/19  5:20 PM   Specimen: BLOOD RIGHT ARM  Result Value Ref Range Status   Specimen  Description BLOOD RIGHT ARM  Final   Special Requests   Final    BOTTLES DRAWN AEROBIC AND ANAEROBIC Blood Culture results may not be optimal due to an inadequate volume of blood received in culture bottles   Culture  Setup Time   Final    GRAM POSITIVE COCCI IN BOTH AEROBIC AND ANAEROBIC BOTTLES CRITICAL RESULT CALLED TO, READ BACK BY AND VERIFIED WITH: PHARMD J FRENS [id number] AT 812 AM BY CM Performed at [facility], [place] [address] [place], [place] [address]    Culture STAPHYLOCOCCUS AUREUS (A)  Final   Report Status PENDING  Incomplete  Culture, blood (single)     Status: Abnormal (Preliminary result)   Collection Time: 12/01/19  7:14 PM   Specimen: BLOOD  Result Value Ref Range Status   Specimen Description BLOOD SITE NOT SPECIFIED  Final   Special Requests   Final    AEROBIC BOTTLE ONLY Blood Culture results may not be optimal due to an inadequate volume of blood received in culture bottles   Culture  Setup Time   Final    GRAM POSITIVE COCCI AEROBIC BOTTLE ONLY CRITICAL VALUE NOTED.  VALUE IS CONSISTENT WITH PREVIOUSLY REPORTED AND CALLED VALUE. Performed at [facility], [place] [address], [place], [place] [address]    Culture STAPHYLOCOCCUS AUREUS (A)  Final   Report Status PENDING  Incomplete  Blood Culture (routine x 2)     Status: Abnormal (Preliminary result)   Collection Time: 12/01/19  7:34 PM   Specimen: BLOOD RIGHT FOREARM  Result Value Ref Range Status   Specimen Description BLOOD RIGHT FOREARM  Final   Special Requests   Final    BOTTLES DRAWN AEROBIC AND ANAEROBIC Blood Culture adequate volume   Culture  Setup Time   Final    GRAM POSITIVE COCCI IN BOTH AEROBIC AND ANAEROBIC BOTTLES CRITICAL RESULT CALLED TO, READ BACK BY AND VERIFIED WITH: PHARMD J FRENS [id number] AT 814 AM BY CM Performed at [facility], [place] [address], [place], [place] [address]    Culture STAPHYLOCOCCUS AUREUS (A)  Final   Report Status PENDING  Incomplete  Blood Culture ID  Panel (Reflexed)     Status: Abnormal   Collection Time: 12/01/19  7:34 PM  Result Value Ref Range Status   Enterococcus species NOT DETECTED NOT DETECTED Final   Listeria monocytogenes NOT DETECTED NOT DETECTED Final   Staphylococcus species DETECTED (A) NOT DETECTED Final    Comment: CRITICAL RESULT CALLED TO, READ BACK BY AND VERIFIED WITH: PHARMD J FRENS  [id number] AT 812 BY CM    Staphylococcus aureus (BCID) DETECTED (A) NOT DETECTED Final    Comment: Methicillin (oxacillin)-resistant Staphylococcus aureus (MRSA). MRSA is predictably resistant to beta-lactam antibiotics (except ceftaroline). Preferred therapy is vancomycin unless clinically contraindicated. Patient requires contact precautions if  hospitalized. CRITICAL RESULT CALLED TO, READ BACK BY AND VERIFIED WITH: PHARMD [id number] AT 812 AM BY CM    Methicillin resistance DETECTED (A) NOT DETECTED Final    Comment: CRITICAL RESULT CALLED TO, READ BACK BY AND VERIFIED WITH: PHARMD J FRENS [id number] AT 812 BY CN=M    Streptococcus species NOT DETECTED NOT DETECTED Final   Streptococcus agalactiae NOT DETECTED NOT DETECTED Final   Streptococcus pneumoniae NOT DETECTED NOT DETECTED Final   Streptococcus pyogenes NOT DETECTED NOT DETECTED Final   Acinetobacter baumannii NOT DETECTED NOT DETECTED Final   Enterobacteriaceae species NOT DETECTED NOT DETECTED Final   Enterobacter cloacae complex NOT DETECTED NOT DETECTED Final   Escherichia coli NOT DETECTED NOT DETECTED Final   Klebsiella oxytoca NOT DETECTED NOT DETECTED Final   Klebsiella pneumoniae NOT DETECTED NOT DETECTED Final   Proteus species NOT DETECTED NOT DETECTED Final   Serratia marcescens NOT DETECTED NOT DETECTED Final   Haemophilus influenzae NOT DETECTED NOT DETECTED Final   Neisseria meningitidis NOT DETECTED NOT DETECTED Final   Pseudomonas aeruginosa NOT DETECTED NOT DETECTED Final   Candida albicans NOT DETECTED NOT DETECTED Final   Candida glabrata NOT DETECTED NOT  DETECTED Final   Candida krusei NOT DETECTED NOT DETECTED Final   Candida parapsilosis NOT DETECTED NOT DETECTED Final   Candida tropicalis NOT DETECTED NOT DETECTED Final    Comment: Performed at [facility], [place]. [address] [place], [place] [address]  Respiratory Panel by RT PCR (Flu A&B, Covid) - Nasopharyngeal Swab     Status: None   Collection Time: 12/01/19  9:05 PM   Specimen: Nasopharyngeal Swab  Result Value Ref Range Status   SARS Coronavirus 2 by RT PCR NEGATIVE NEGATIVE Final    Comment: (NOTE) SARS-CoV-2 target nucleic acids are NOT DETECTED. The SARS-CoV-2 RNA is generally detectable in upper respiratoy specimens during the acute phase of infection. The lowest concentration of SARS-CoV-2 viral copies this assay can detect is 131 copies/mL. A negative result does not preclude SARS-Cov-2 infection and should not be used as the sole basis for treatment or other patient management decisions. A negative result may occur with  improper specimen collection/handling, submission of specimen other than nasopharyngeal swab, presence of viral mutation(s) within the areas targeted by this assay, and inadequate number of viral copies (<131 copies/mL). A negative result must be combined with clinical observations, patient history, and epidemiological information. The expected result is Negative. Fact Sheet for Patients:  PinkCheek.be Fact Sheet for Healthcare Providers:  GravelBags.it This test is not yet ap proved or cleared by the [place] FDA and  has been authorized for detection and/or diagnosis of SARS-CoV-2 by FDA under an Emergency Use Authorization (EUA). This EUA will remain  in effect (meaning this test can be used) for the duration of the COVID-19 declaration under Section 564(b)(1) of the Act, 21 U.S.C. section 360bbb-3(b)(1), unless the authorization is terminated or revoked sooner.    Influenza A by  PCR NEGATIVE NEGATIVE Final   Influenza B by PCR NEGATIVE NEGATIVE Final    Comment: (NOTE) The Xpert Xpress SARS-CoV-2/FLU/RSV assay is intended as an aid in  the diagnosis of influenza from Nasopharyngeal swab specimens and  should not be  used as a sole basis for treatment. Nasal washings and  aspirates are unacceptable for Xpert Xpress SARS-CoV-2/FLU/RSV  testing. Fact Sheet for Patients: PinkCheek.be Fact Sheet for Healthcare Providers: GravelBags.it This test is not yet approved or cleared by the [place] FDA and  has been authorized for detection and/or diagnosis of SARS-CoV-2 by  FDA under an Emergency Use Authorization (EUA). This EUA will remain  in effect (meaning this test can be used) for the duration of the  Covid-19 declaration under Section 564(b)(1) of the Act, 21  U.S.C. section 360bbb-3(b)(1), unless the authorization is  terminated or revoked. Performed at [facility], [place] [address] [place], [place] [address]   Culture, blood (routine x 2)     Status: None (Preliminary result)   Collection Time: 12/02/19  9:10 PM   Specimen: BLOOD RIGHT HAND  Result Value Ref Range Status   Specimen Description BLOOD RIGHT HAND  Final   Special Requests   Final    BOTTLES DRAWN AEROBIC AND ANAEROBIC Blood Culture adequate volume   Culture   Final    NO GROWTH < 12 HOURS Performed at [facility], [place] [address] [place], [place] [address]    Report Status PENDING  Incomplete  Culture, blood (routine x 2)     Status: None (Preliminary result)   Collection Time: 12/02/19  9:20 PM   Specimen: BLOOD LEFT HAND  Result Value Ref Range Status   Specimen Description BLOOD LEFT HAND  Final   Special Requests   Final    BOTTLES DRAWN AEROBIC AND ANAEROBIC Blood Culture adequate volume   Culture   Final    NO GROWTH < 12 HOURS Performed at [facility], [place] [address] [place], [place] [address]     Report Status PENDING  Incomplete     Terri Piedra, [place] for [place] Pager  12/03/2019  9:11 AM

## 2019-12-04 ENCOUNTER — Inpatient Hospital Stay (HOSPITAL_COMMUNITY): Payer: BLUE CROSS/BLUE SHIELD

## 2019-12-04 ENCOUNTER — Encounter (HOSPITAL_COMMUNITY): Payer: Self-pay | Admitting: Internal Medicine

## 2019-12-04 ENCOUNTER — Inpatient Hospital Stay (HOSPITAL_COMMUNITY): Payer: BLUE CROSS/BLUE SHIELD | Admitting: Anesthesiology

## 2019-12-04 ENCOUNTER — Encounter (HOSPITAL_COMMUNITY)
Admission: EM | Disposition: A | Discharge status: Home Health | Payer: Self-pay | Source: Home / Self Care | Attending: Internal Medicine

## 2019-12-04 DIAGNOSIS — R7881 Bacteremia: Secondary | ICD-10-CM

## 2019-12-04 DIAGNOSIS — B9562 Methicillin resistant Staphylococcus aureus infection as the cause of diseases classified elsewhere: Secondary | ICD-10-CM

## 2019-12-04 DIAGNOSIS — I340 Nonrheumatic mitral (valve) insufficiency: Secondary | ICD-10-CM

## 2019-12-04 HISTORY — PX: IR US GUIDE VASC ACCESS RIGHT: IMG2390

## 2019-12-04 HISTORY — PX: TEE WITHOUT CARDIOVERSION: SHX5443

## 2019-12-04 HISTORY — PX: IR FLUORO GUIDE CV LINE RIGHT: IMG2283

## 2019-12-04 LAB — COMPREHENSIVE METABOLIC PANEL
ALT: 56 U/L — ABNORMAL HIGH (ref 0–44)
AST: 92 U/L — ABNORMAL HIGH (ref 15–41)
Albumin: 1.8 g/dL — ABNORMAL LOW (ref 3.5–5.0)
Alkaline Phosphatase: 252 U/L — ABNORMAL HIGH (ref 38–126)
Anion gap: 13 (ref 5–15)
BUN: 70 mg/dL — ABNORMAL HIGH (ref 8–23)
CO2: 21 mmol/L — ABNORMAL LOW (ref 22–32)
Calcium: 8.2 mg/dL — ABNORMAL LOW (ref 8.9–10.3)
Chloride: 95 mmol/L — ABNORMAL LOW (ref 98–111)
Creatinine, Ser: 8.48 mg/dL — ABNORMAL HIGH (ref 0.61–1.24)
GFR calc Af Amer: 7 mL/min — ABNORMAL LOW (ref >60)
GFR calc non Af Amer: 6 mL/min — ABNORMAL LOW (ref >60)
Glucose, Bld: 138 mg/dL — ABNORMAL HIGH (ref 70–99)
Potassium: 3.8 mmol/L (ref 3.5–5.1)
Sodium: 129 mmol/L — ABNORMAL LOW (ref 135–145)
Total Bilirubin: 1.1 mg/dL (ref 0.3–1.2)
Total Protein: 6.7 g/dL (ref 6.5–8.1)

## 2019-12-04 LAB — CULTURE, BLOOD (SINGLE)

## 2019-12-04 LAB — CULTURE, BLOOD (ROUTINE X 2): Special Requests: ADEQUATE

## 2019-12-04 LAB — CBC
HCT: 30.2 % — ABNORMAL LOW (ref 39.0–52.0)
Hemoglobin: 9.4 g/dL — ABNORMAL LOW (ref 13.0–17.0)
MCH: 25.3 pg — ABNORMAL LOW (ref 26.0–34.0)
MCHC: 31.1 g/dL (ref 30.0–36.0)
MCV: 81.2 fL (ref 80.0–100.0)
Platelets: 86 10*3/uL — ABNORMAL LOW (ref 150–400)
RBC: 3.72 MIL/uL — ABNORMAL LOW (ref 4.22–5.81)
RDW: 16.1 % — ABNORMAL HIGH (ref 11.5–15.5)
WBC: 4.4 10*3/uL (ref 4.0–10.5)
nRBC: 0 % (ref 0.0–0.2)

## 2019-12-04 SURGERY — ECHOCARDIOGRAM, TRANSESOPHAGEAL
Anesthesia: Monitor Anesthesia Care

## 2019-12-04 MED ORDER — PROPOFOL 10 MG/ML IV BOLUS
INTRAVENOUS | Status: DC | PRN
  Administered 2019-12-04 (×2): 20 mg via INTRAVENOUS

## 2019-12-04 MED ORDER — LIDOCAINE HCL 1 % IJ SOLN
INTRAMUSCULAR | Status: AC
  Filled 2019-12-04: qty 20

## 2019-12-04 MED ORDER — HEPARIN SODIUM (PORCINE) 1000 UNIT/ML IJ SOLN
INTRAMUSCULAR | Status: AC
  Filled 2019-12-04: qty 1

## 2019-12-04 MED ORDER — PROPOFOL 500 MG/50ML IV EMUL
INTRAVENOUS | Status: DC | PRN
  Administered 2019-12-04: 75 ug/kg/min via INTRAVENOUS

## 2019-12-04 MED ORDER — HEPARIN SODIUM (PORCINE) 1000 UNIT/ML IJ SOLN
INTRAMUSCULAR | Status: AC | PRN
  Administered 2019-12-04: 2800 [IU] via INTRAVENOUS

## 2019-12-04 MED ORDER — SODIUM CHLORIDE 0.9 % IV SOLN: INTRAVENOUS | Status: DC | PRN

## 2019-12-04 MED ORDER — PHENYLEPHRINE 40 MCG/ML (10ML) SYRINGE FOR IV PUSH (FOR BLOOD PRESSURE SUPPORT)
PREFILLED_SYRINGE | INTRAVENOUS | Status: DC | PRN
  Administered 2019-12-04: 120 ug via INTRAVENOUS

## 2019-12-04 MED ORDER — LIDOCAINE HCL 1 % IJ SOLN
INTRAMUSCULAR | Status: AC | PRN
  Administered 2019-12-04: 5 mL

## 2019-12-04 MED ORDER — BUTAMBEN-TETRACAINE-BENZOCAINE 2-2-14 % EX AERO
INHALATION_SPRAY | CUTANEOUS | Status: DC | PRN
  Administered 2019-12-04: 2 via TOPICAL

## 2019-12-04 MED ORDER — LIDOCAINE HCL (PF) 1 % IJ SOLN
INTRAMUSCULAR | Status: AC | PRN
  Administered 2019-12-04: 10 mL

## 2019-12-04 NOTE — Anesthesia Postprocedure Evaluation (Signed)
Anesthesia Post Note  Patient: Carlos Hamilton  Procedure(s) Performed: TRANSESOPHAGEAL ECHOCARDIOGRAM (TEE) with propofol (N/A )     Patient location during evaluation: Endoscopy Anesthesia Type: MAC Level of consciousness: awake and alert Pain management: pain level controlled Vital Signs Assessment: post-procedure vital signs reviewed and stable Respiratory status: spontaneous breathing, nonlabored ventilation, respiratory function stable and patient connected to nasal cannula oxygen Cardiovascular status: stable and blood pressure returned to baseline Postop Assessment: no apparent nausea or vomiting Anesthetic complications: no    Last Vitals:  Vitals:   12/04/19 1043 12/04/19 1058  BP: (!) 107/57 92/64  Pulse: 73 75  Resp: 18 18  Temp:  36.7 C  SpO2: 97% 97%    Last Pain:  Vitals:   12/04/19 1058  TempSrc: Oral  PainSc:                  Lynda Rainwater

## 2019-12-04 NOTE — Progress Notes (Signed)
[place] [facility] for Infectious Disease  Date of Admission:  12/01/2019     Total days of antibiotics 3 Vancomycin Day 3         ASSESSMENT:  [name] Hamilton TEE is negative for endocarditis with preserved valve structure and function. Repeat cultures drawn from 1/13 have been without growth to date and new cultures obtained post-cathter removal. Ok to continue with plan for temporary dialysis cathter and then tunneled dialysis cathter placement once cultures are cleared. Continue current dose of vancomycin with dialysis. If cultures remain negative will recommend 3 weeks of vancomycin with potential end date of 12/23/19.   PLAN:  1. Continue vancomycin with dialysis. 2. Monitor repeat cultures for continued clearance of bacteremia. 3. Tunneled dialysis cathter placement pending confirmed clearance of blood cultures.  4. No follow up with ID necessary following completion of treatment.    Principal Problem:   MRSA bacteremia Active Problems:   Sepsis ([place])   ESRD on dialysis ([place])   Diabetes mellitus with Charcot's joint arthropathy ([place])   Charcot's joint, right ankle and foot   . Chlorhexidine Gluconate Cloth  6 each Topical Q0600  . feeding supplement (PRO-STAT SUGAR FREE 64)  30 mL Oral BID  . heparin  5,000 Units Subcutaneous Q8H  . midodrine  10 mg Oral TID AC  . multivitamin  1 tablet Oral QHS  . pantoprazole  40 mg Oral Daily  . sevelamer carbonate  800 mg Oral TID WC  . sodium chloride flush  3 mL Intravenous Q12H    SUBJECTIVE:  Afebrile overnight with no acute events. TEE negative for endocarditis. Feeling well today. Has questions about using his boot and weight bearing on his foot.   No Known Allergies   Review of Systems: Review of Systems  Constitutional: Negative for chills, fever and weight loss.  Respiratory: Negative for cough, shortness of breath and wheezing.   Cardiovascular: Negative for chest pain and leg swelling.  Gastrointestinal: Negative for  abdominal pain, constipation, diarrhea, nausea and vomiting.  Skin: Negative for rash.    OBJECTIVE: Vitals:   12/04/19 1021 12/04/19 1025 12/04/19 1039 12/04/19 1043  BP: (!) 101/59 (!) 85/49 (!) 84/54 (!) 107/57  Pulse: 69 73 76 73  Resp: 17 19 18 18   Temp:      TempSrc:      SpO2: 98% 96% 98% 97%  Weight:      Height:       Body mass index is 29.16 kg/m.  Physical Exam Constitutional:      General: He is not in acute distress.    Appearance: He is well-developed.  Cardiovascular:     Rate and Rhythm: Normal rate and regular rhythm.     Heart sounds: Murmur present.  Pulmonary:     Effort: Pulmonary effort is normal.     Breath sounds: Normal breath sounds.  Skin:    General: Skin is warm and dry.  Neurological:     Mental Status: He is alert and oriented to person, place, and time.  Psychiatric:        Mood and Affect: Mood normal.     Lab Results Lab Results  Component Value Date   WBC 4.4 12/04/2019   HGB 9.4 (L) 12/04/2019   HCT 30.2 (L) 12/04/2019   MCV 81.2 12/04/2019   PLT 86 (L) 12/04/2019    Lab Results  Component Value Date   CREATININE 8.48 (H) 12/04/2019   BUN 70 (H) 12/04/2019   NA 129 (  L) 12/04/2019   K 3.8 12/04/2019   CL 95 (L) 12/04/2019   CO2 21 (L) 12/04/2019    Lab Results  Component Value Date   ALT 56 (H) 12/04/2019   AST 92 (H) 12/04/2019   ALKPHOS 252 (H) 12/04/2019   BILITOT 1.1 12/04/2019     Microbiology: Recent Results (from the past 240 hour(s))  Blood Culture (routine x 2)     Status: Abnormal   Collection Time: 12/01/19  5:20 PM   Specimen: BLOOD RIGHT ARM  Result Value Ref Range Status   Specimen Description BLOOD RIGHT ARM  Final   Special Requests   Final    BOTTLES DRAWN AEROBIC AND ANAEROBIC Blood Culture results may not be optimal due to an inadequate volume of blood received in culture bottles   Culture  Setup Time   Final    GRAM POSITIVE COCCI IN BOTH AEROBIC AND ANAEROBIC BOTTLES CRITICAL RESULT  CALLED TO, READ BACK BY AND VERIFIED WITH: PHARMD J FRENS [id number] AT 812 AM BY CM    Culture (A)  Final    STAPHYLOCOCCUS AUREUS SUSCEPTIBILITIES PERFORMED ON PREVIOUS CULTURE WITHIN THE LAST 5 DAYS. Performed at [facility], [place] [address] [place], [place] [address]    Report Status 12/04/2019 FINAL  Final  Culture, blood (single)     Status: Abnormal   Collection Time: 12/01/19  7:14 PM   Specimen: BLOOD  Result Value Ref Range Status   Specimen Description BLOOD SITE NOT SPECIFIED  Final   Special Requests   Final    AEROBIC BOTTLE ONLY Blood Culture results may not be optimal due to an inadequate volume of blood received in culture bottles   Culture  Setup Time   Final    GRAM POSITIVE COCCI AEROBIC BOTTLE ONLY CRITICAL VALUE NOTED.  VALUE IS CONSISTENT WITH PREVIOUSLY REPORTED AND CALLED VALUE.    Culture (A)  Final    STAPHYLOCOCCUS AUREUS SUSCEPTIBILITIES PERFORMED ON PREVIOUS CULTURE WITHIN THE LAST 5 DAYS. Performed at [facility], [place] [address] [place], [place] [address]    Report Status 12/04/2019 FINAL  Final  Blood Culture (routine x 2)     Status: Abnormal   Collection Time: 12/01/19  7:34 PM   Specimen: BLOOD RIGHT FOREARM  Result Value Ref Range Status   Specimen Description BLOOD RIGHT FOREARM  Final   Special Requests   Final    BOTTLES DRAWN AEROBIC AND ANAEROBIC Blood Culture adequate volume   Culture  Setup Time   Final    GRAM POSITIVE COCCI IN BOTH AEROBIC AND ANAEROBIC BOTTLES CRITICAL RESULT CALLED TO, READ BACK BY AND VERIFIED WITH: PHARMD J FRENS [id number] AT 814 AM BY CM Performed at [facility], [place] [address] [place], [place] [address]    Culture METHICILLIN RESISTANT STAPHYLOCOCCUS AUREUS (A)  Final   Report Status 12/04/2019 FINAL  Final   Organism ID, Bacteria METHICILLIN RESISTANT STAPHYLOCOCCUS AUREUS  Final      Susceptibility   Methicillin resistant staphylococcus aureus - MIC*    CIPROFLOXACIN >=8 RESISTANT  Resistant     ERYTHROMYCIN >=8 RESISTANT Resistant     GENTAMICIN <=0.5 SENSITIVE Sensitive     OXACILLIN >=4 RESISTANT Resistant     TETRACYCLINE <=1 SENSITIVE Sensitive     VANCOMYCIN 1 SENSITIVE Sensitive     TRIMETH/SULFA <=10 SENSITIVE Sensitive     CLINDAMYCIN <=0.25 SENSITIVE Sensitive     RIFAMPIN <=0.5 SENSITIVE Sensitive     Inducible  Clindamycin NEGATIVE Sensitive     * METHICILLIN RESISTANT STAPHYLOCOCCUS AUREUS  Blood Culture ID Panel (Reflexed)     Status: Abnormal   Collection Time: 12/01/19  7:34 PM  Result Value Ref Range Status   Enterococcus species NOT DETECTED NOT DETECTED Final   Listeria monocytogenes NOT DETECTED NOT DETECTED Final   Staphylococcus species DETECTED (A) NOT DETECTED Final    Comment: CRITICAL RESULT CALLED TO, READ BACK BY AND VERIFIED WITH: PHARMD J FRENS [id number] AT 812 BY CM    Staphylococcus aureus (BCID) DETECTED (A) NOT DETECTED Final    Comment: Methicillin (oxacillin)-resistant Staphylococcus aureus (MRSA). MRSA is predictably resistant to beta-lactam antibiotics (except ceftaroline). Preferred therapy is vancomycin unless clinically contraindicated. Patient requires contact precautions if  hospitalized. CRITICAL RESULT CALLED TO, READ BACK BY AND VERIFIED WITH: PHARMD [id number] AT 812 AM BY CM    Methicillin resistance DETECTED (A) NOT DETECTED Final    Comment: CRITICAL RESULT CALLED TO, READ BACK BY AND VERIFIED WITH: PHARMD J FRENS [id number] AT 812 BY CN=M    Streptococcus species NOT DETECTED NOT DETECTED Final   Streptococcus agalactiae NOT DETECTED NOT DETECTED Final   Streptococcus pneumoniae NOT DETECTED NOT DETECTED Final   Streptococcus pyogenes NOT DETECTED NOT DETECTED Final   Acinetobacter baumannii NOT DETECTED NOT DETECTED Final   Enterobacteriaceae species NOT DETECTED NOT DETECTED Final   Enterobacter cloacae complex NOT DETECTED NOT DETECTED Final   Escherichia coli NOT DETECTED NOT DETECTED Final   Klebsiella oxytoca  NOT DETECTED NOT DETECTED Final   Klebsiella pneumoniae NOT DETECTED NOT DETECTED Final   Proteus species NOT DETECTED NOT DETECTED Final   Serratia marcescens NOT DETECTED NOT DETECTED Final   Haemophilus influenzae NOT DETECTED NOT DETECTED Final   Neisseria meningitidis NOT DETECTED NOT DETECTED Final   Pseudomonas aeruginosa NOT DETECTED NOT DETECTED Final   Candida albicans NOT DETECTED NOT DETECTED Final   Candida glabrata NOT DETECTED NOT DETECTED Final   Candida krusei NOT DETECTED NOT DETECTED Final   Candida parapsilosis NOT DETECTED NOT DETECTED Final   Candida tropicalis NOT DETECTED NOT DETECTED Final    Comment: Performed at [facility], [place]. [address] [place], [place] [address]  Respiratory Panel by RT PCR (Flu A&B, Covid) - Nasopharyngeal Swab     Status: None   Collection Time: 12/01/19  9:05 PM   Specimen: Nasopharyngeal Swab  Result Value Ref Range Status   SARS Coronavirus 2 by RT PCR NEGATIVE NEGATIVE Final    Comment: (NOTE) SARS-CoV-2 target nucleic acids are NOT DETECTED. The SARS-CoV-2 RNA is generally detectable in upper respiratoy specimens during the acute phase of infection. The lowest concentration of SARS-CoV-2 viral copies this assay can detect is 131 copies/mL. A negative result does not preclude SARS-Cov-2 infection and should not be used as the sole basis for treatment or other patient management decisions. A negative result may occur with  improper specimen collection/handling, submission of specimen other than nasopharyngeal swab, presence of viral mutation(s) within the areas targeted by this assay, and inadequate number of viral copies (<131 copies/mL). A negative result must be combined with clinical observations, patient history, and epidemiological information. The expected result is Negative. Fact Sheet for Patients:  PinkCheek.be Fact Sheet for Healthcare Providers:    GravelBags.it This test is not yet ap proved or cleared by the [place] FDA and  has been authorized for detection and/or diagnosis of SARS-CoV-2 by FDA under an Emergency Use Authorization (EUA). This EUA will remain  in effect (meaning this test can be used) for the duration of the COVID-19 declaration under Section 564(b)(1) of the Act, 21 U.S.C. section 360bbb-3(b)(1), unless the authorization is terminated or revoked sooner.    Influenza A by PCR NEGATIVE NEGATIVE Final   Influenza B by PCR NEGATIVE NEGATIVE Final    Comment: (NOTE) The Xpert Xpress SARS-CoV-2/FLU/RSV assay is intended as an aid in  the diagnosis of influenza from Nasopharyngeal swab specimens and  should not be used as a sole basis for treatment. Nasal washings and  aspirates are unacceptable for Xpert Xpress SARS-CoV-2/FLU/RSV  testing. Fact Sheet for Patients: PinkCheek.be Fact Sheet for Healthcare Providers: GravelBags.it This test is not yet approved or cleared by the [place] FDA and  has been authorized for detection and/or diagnosis of SARS-CoV-2 by  FDA under an Emergency Use Authorization (EUA). This EUA will remain  in effect (meaning this test can be used) for the duration of the  Covid-19 declaration under Section 564(b)(1) of the Act, 21  U.S.C. section 360bbb-3(b)(1), unless the authorization is  terminated or revoked. Performed at [facility], [place] [address] [place], [place] [address]   Culture, blood (routine x 2)     Status: None (Preliminary result)   Collection Time: 12/02/19  9:10 PM   Specimen: BLOOD RIGHT HAND  Result Value Ref Range Status   Specimen Description BLOOD RIGHT HAND  Final   Special Requests   Final    BOTTLES DRAWN AEROBIC AND ANAEROBIC Blood Culture adequate volume   Culture   Final    NO GROWTH < 24 HOURS Performed at [facility], [place] [address]  [place], [place] [address]    Report Status PENDING  Incomplete  Culture, blood (routine x 2)     Status: None (Preliminary result)   Collection Time: 12/02/19  9:20 PM   Specimen: BLOOD LEFT HAND  Result Value Ref Range Status   Specimen Description BLOOD LEFT HAND  Final   Special Requests   Final    BOTTLES DRAWN AEROBIC AND ANAEROBIC Blood Culture adequate volume   Culture   Final    NO GROWTH < 24 HOURS Performed at [facility], [place] [address], [place], [place] [address]    Report Status PENDING  Incomplete  MRSA PCR Screening     Status: None   Collection Time: 12/03/19  8:12 PM   Specimen: Nasal Mucosa; Nasopharyngeal  Result Value Ref Range Status   MRSA by PCR NEGATIVE NEGATIVE Final    Comment:        The GeneXpert MRSA Assay (FDA approved for NASAL specimens only), is one component of a comprehensive MRSA colonization surveillance program. It is not intended to diagnose MRSA infection nor to guide or monitor treatment for MRSA infections. Performed at [facility], [place] [address], [place], [place] [address]      Terri Piedra, [place] [phone] Pager  12/04/2019  10:51 AM

## 2019-12-04 NOTE — Progress Notes (Signed)
Subjective: No acute events overnight. Carlos Hamilton was tired on our evaluation this morning, but otherwise feeling well. No fevers, chills, nausea, vomiting, chest pain or joint pain. Discussed plan for TEE today. All other questions and concerns were addressed.   Objective:  Vital signs in last 24 hours: Vitals:   12/03/19 0945 12/03/19 1736 12/03/19 2126 12/04/19 0515  BP: 96/68 (!) 90/55 (!) 94/59 98/68  Pulse: 84 70 73 73  Resp: 18 18 18 16   Temp: 98.6 F (37 C) 98.5 F (36.9 C) 99.2 F (37.3 C) 98.6 F (37 C)  TempSrc:   Oral Oral  SpO2: 97% 98% 95% 98%  Weight:   95.2 kg   Height:       Physical Exam Constitutional:      General: He is not in acute distress.    Appearance: He is not ill-appearing.     Comments: Pt is sleepy but alert and engaged in conversation.  Cardiovascular:     Rate and Rhythm: Normal rate and regular rhythm.     Heart sounds: Normal heart sounds.  Pulmonary:     Effort: Pulmonary effort is normal.     Breath sounds: Normal breath sounds.  Musculoskeletal:     Comments: Stable Charcot deformity of R foot.  Skin:    General: Skin is warm and dry.     Comments: Stable chronic venous stasis changes on bilateral shins.  Psychiatric:        Mood and Affect: Mood normal.    Assessment/Plan:  Principal Problem:   MRSA bacteremia Active Problems:   Sepsis ([place])   ESRD on dialysis ([place])   Diabetes mellitus with Charcot's joint arthropathy ([place])   Charcot's joint, right ankle and foot  Carlos Hamilton is a 63 year old M with significant PMH of hepatorenal syndrome, ESRD on dialysis TTS, diabetes mellitus, and bell's palsy, who presented with AMS, fever,hypotension, and an elevated lactate with work-up significant for sepsis secondary to MRSA bacteremia.  MRSA bacteremia Pt has remained afebrile for >48hr and without leukocytosis. Blood cultures from 1/12 positive for MRSA on BCID in 4/4 bottles. Suspect right sided TDC is the infectious source.   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 Last dialysis session 1/13. 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 No signs of hepatic encephalopathy or significant ascites. - blood work remains stable - continue home midodrine 10mg  TID  Diet-controlled DM  Hemoglobin A1c 6.1 on 11/26/2019 - not currently on any medications  [place] consulted, appreciate their input - discussed non-operative treatments vs transtibial amputation on the right - pt considering his options and plan for follow-up with Dr.  Duda outpatient in 1 week.    Diet - NPO for procedure; afterwards, renal with 1255mL fluid restriction Fluids - none DVT ppx- heparin 5,000 units subQ q8h CODE STATUS- DNR/DNI   Dispo: Anticipated discharge in approximately 3-4 day(s).    Ladona Horns, MD 12/04/2019, 7:06 AM Pager: [phone]

## 2019-12-04 NOTE — Progress Notes (Signed)
Subjective:  No new c/o's -  HD last night-  Working on some insurance issue- on his way to get his temp HD cath  Objective Vital signs in last 24 hours: Vitals:   12/04/19 1025 12/04/19 1039 12/04/19 1043 12/04/19 1058  BP: (!) 85/49 (!) 84/54 (!) 107/57 92/64  Pulse: 73 76 73 75  Resp: 19 18 18 18   Temp:    98 F (36.7 C)  TempSrc:    Oral  SpO2: 96% 98% 97% 97%  Weight:      Height:       Weight change: -0.9 kg  Intake/Output Summary (Last 24 hours) at 12/04/2019 1430 Last data filed at 12/04/2019 0810 Gross per 24 hour  Intake 510 ml  Output 0 ml  Net 510 ml   Dialysis Orders: Center: [facility]- TTS 3 hr 45 min 97 kg (214 lbs) 3.0K/2.0 Ca 400/800 RIJ TDC -No heparin -Aranesp 75 mcg IV weekly due 12/03/19 Last hgb 9.9   Assessment/Plan: 1. Sepsis D/T MRSA Bacteremia-seen by ID. MRI RLE negative for osteo, believe bacteremia related to tunneled dialysis catheter infection.  TDC removed by IR on 1/14. He will have temporary HD catheter placed this afternoon until final BC results available. If clear, can have TDC replaced in IR next week. Echo pending. Will need 6 weeks of Vanc IV per ID recs. Per primary/ID- this can be given at the OP unit.  Will plan next HD on Sat/tomorrow to keep on schedule 2. 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 AST/ALT elevated. Per primary 3. R Charcot foot-Ortho consulted. Per primary. 4.  ESRD -  Usually T,Th,S.  HD off schedule Wed night. 2.0 K bath, no heparin. Next tentatively planned for Sat/tomorrow to keep on schedule.  I did tell him that this is the argument for permanent access-  Apparently has been refusing as OP   5.  Hypertension/volume  - BP on soft side. On midodrine 10 mg PO TID. UF as tolerated. Does not seem overtly volume overloaded  6.  Anemia  -HGB 8.5 down from 9.9 last week. Aranesp given, increase dose to 100 mcg IV. No acute blood loss reported.  is stable 7.  Metabolic bone disease - On  renvela binders, no VDRA.  8.  Nutrition - Albumin low. Renal diet, prostat, renal vits.    Louis Meckel    Labs: Basic Metabolic Panel: Recent Labs  Lab 12/02/19 0336 12/03/19 0446 12/04/19 0503  NA 132* 132* 129*  K 4.3 3.7 3.8  CL 97* 95* 95*  CO2 21* 22 21*  GLUCOSE 115* 124* 138*  BUN 57* 49* 70*  CREATININE 7.17* 6.03* 8.48*  CALCIUM 8.0* 8.0* 8.2*   Liver Function Tests: Recent Labs  Lab 12/02/19 0336 12/03/19 0446 12/04/19 0503  AST 154* 106* 92*  ALT 69* 57* 56*  ALKPHOS 234* 229* 252*  BILITOT 1.2 1.0 1.1  PROT 7.3 6.8 6.7  ALBUMIN 1.9* 1.8* 1.8*   No results for input(s): LIPASE, AMYLASE in the last 168 hours. Recent Labs  Lab 12/02/19 0031  AMMONIA 35   CBC: Recent Labs  Lab 12/01/19 1714 12/01/19 1733 12/02/19 0336 12/03/19 0446 12/04/19 0503  WBC 5.7   < > 5.2 4.2 4.4  NEUTROABS 4.5  --   --   --   --   HGB 9.8*   < > 8.5* 8.7* 9.4*  HCT 31.9*   < > 27.9* 28.1* 30.2*  MCV 82.6  --  82.8  81.4 81.2  PLT 86*   < > 80* 68* 86*   < > = values in this interval not displayed.   Cardiac Enzymes: No results for input(s): CKTOTAL, CKMB, CKMBINDEX, TROPONINI in the last 168 hours. CBG: Recent Labs  Lab 12/01/19 1925  GLUCAP 114*    Iron Studies: No results for input(s): IRON, TIBC, TRANSFERRIN, FERRITIN in the last 72 hours. Studies/Results: IR Removal Tun Cv Cath W/O FL  Result Date: 12/03/2019 INDICATION: End-stage renal disease. Bacteremia. Request removal of tunneled hemodialysis catheter. EXAM: REMOVAL OF TUNNELED RIGHT IJ HEMODIALYSIS CATHETER MEDICATIONS: None COMPLICATIONS: None immediate. PROCEDURE: Informed written consent was obtained from the patient following an explanation of the procedure, risks, benefits and alternatives to treatment. A time out was performed prior to the initiation of the procedure. Maximal barrier sterile technique was utilized including caps, mask, sterile gowns, sterile gloves, large sterile drape,  hand hygiene, and chlorhexidine. 1% lidocaine was injected under sterile conditions along the subcutaneous tunnel. Utilizing a combination of blunt dissection and gentle traction, the cuff of the catheter was exposed and the catheter was removed intact. Hemostasis was obtained with manual compression. A dressing was placed. The patient tolerated the procedure well without immediate post procedural complication. IMPRESSION: Successful removal of tunneled right IJ dialysis catheter. Read by: Ascencion Dike PA-C Electronically Signed   By: Jerilynn Mages.  Shick M.D.   On: 12/03/2019 12:06   Medications: Infusions: . sodium chloride    . sodium chloride    . vancomycin 750 mg (12/03/19 1107)    Scheduled Medications: . Chlorhexidine Gluconate Cloth  6 each Topical Q0600  . feeding supplement (PRO-STAT SUGAR FREE 64)  30 mL Oral BID  . heparin  5,000 Units Subcutaneous Q8H  . midodrine  10 mg Oral TID AC  . multivitamin  1 tablet Oral QHS  . pantoprazole  40 mg Oral Daily  . sevelamer carbonate  800 mg Oral TID WC  . sodium chloride flush  3 mL Intravenous Q12H    have reviewed scheduled and prn medications.  Physical Exam: General: lying flat, NAD Heart: RRR Lungs: mostly clear Abdomen: slightly distended-  ascites Extremities: min dep edema Dialysis Access: none at present    12/04/2019,2:30 PM  LOS: 2 days

## 2019-12-04 NOTE — Procedures (Signed)
Interventional Radiology Procedure Note  Procedure: 20 cm right IJ triple lumen non-tunneled HD catheter placed.  Tip in the RA and ready for use.   Complications: None  Estimated Blood Loss: None  Recommendations: - Routine line care   Signed,  Criselda Peaches, MD

## 2019-12-04 NOTE — Progress Notes (Signed)
*  PRELIMINARY RESULTS* Echocardiogram Echocardiogram Transesophageal has been performed.  [name] 12/04/2019, 10:23 AM

## 2019-12-04 NOTE — Plan of Care (Signed)
  Problem: Education: Goal: Knowledge of disease and its progression will improve Outcome: Completed/Met   Problem: Fluid Volume: Goal: Compliance with measures to maintain balanced fluid volume will improve Outcome: Completed/Met   Problem: Health Behavior/Discharge Planning: Goal: Ability to manage health-related needs will improve Outcome: Completed/Met   Problem: Nutritional: Goal: Ability to make healthy dietary choices will improve Outcome: Completed/Met

## 2019-12-04 NOTE — Transfer of Care (Signed)
Immediate Anesthesia Transfer of Care Note  Patient: Carlos Hamilton  Procedure(s) Performed: TRANSESOPHAGEAL ECHOCARDIOGRAM (TEE) with propofol (N/A )  Patient Location: Endoscopy Unit  Anesthesia Type:MAC  Level of Consciousness: oriented, drowsy and patient cooperative  Airway & Oxygen Therapy: Patient Spontanous Breathing and Patient connected to nasal cannula oxygen  Post-op Assessment: Report given to RN and Post -op Vital signs reviewed and stable  Post vital signs: Reviewed  Last Vitals:  Vitals Value Taken Time  BP 101/59 12/04/19 1019  Temp    Pulse 69 12/04/19 1021  Resp 17 12/04/19 1021  SpO2 98 % 12/04/19 1021  Vitals shown include unvalidated device data.  Last Pain:  Vitals:   12/04/19 1019  TempSrc:   PainSc: 0-No pain      Patients Stated Pain Goal: 0 (16/10/96 0454)  Complications: No apparent anesthesia complications

## 2019-12-04 NOTE — Procedures (Signed)
Patient declined CPAP for tonight.  

## 2019-12-04 NOTE — Anesthesia Preprocedure Evaluation (Addendum)
Anesthesia Evaluation  Patient identified by MRN, date of birth, ID band Patient awake    Reviewed: Allergy & Precautions, NPO status , Patient's Chart, lab work & pertinent test results  Airway Mallampati: II  TM Distance: >3 FB Neck ROM: Full    Dental no notable dental hx. (+) Dental Advisory Given, Teeth Intact   Pulmonary sleep apnea ,    Pulmonary exam normal breath sounds clear to auscultation       Cardiovascular negative cardio ROS Normal cardiovascular exam Rhythm:Regular Rate:Normal     Neuro/Psych negative neurological ROS  negative psych ROS   GI/Hepatic negative GI ROS, Neg liver ROS,   Endo/Other  negative endocrine ROSdiabetes  Renal/GU ESRF and DialysisRenal disease  negative genitourinary   Musculoskeletal negative musculoskeletal ROS (+)   Abdominal   Peds negative pediatric ROS (+)  Hematology negative hematology ROS (+)   Anesthesia Other Findings   Reproductive/Obstetrics negative OB ROS                            Anesthesia Physical Anesthesia Plan  ASA: IV  Anesthesia Plan: MAC   Post-op Pain Management:    Induction: Intravenous  PONV Risk Score and Plan: 1 and Treatment may vary due to age or medical condition  Airway Management Planned: Nasal Cannula  Additional Equipment:   Intra-op Plan:   Post-operative Plan:   Informed Consent: I have reviewed the patients History and Physical, chart, labs and discussed the procedure including the risks, benefits and alternatives for the proposed anesthesia with the patient or authorized representative who has indicated his/her understanding and acceptance.     Dental advisory given  Plan Discussed with: CRNA  Anesthesia Plan Comments:         Anesthesia Quick Evaluation

## 2019-12-04 NOTE — CV Procedure (Signed)
TRANSESOPHAGEAL ECHOCARDIOGRAM (TEE) NOTE  INDICATIONS: infective endocarditis  PROCEDURE:   Informed consent was obtained prior to the procedure. The risks, benefits and alternatives for the procedure were discussed and the patient comprehended these risks.  Risks include, but are not limited to, cough, sore throat, vomiting, nausea, somnolence, esophageal and stomach trauma or perforation, bleeding, low blood pressure, aspiration, pneumonia, infection, trauma to the teeth and death.    After a procedural time-out, the patient was given propofol per anesthesia for sedation.  The patient's heart rate, blood pressure, and oxygen saturation are monitored continuously during the procedure.  The transesophageal probe was inserted in the esophagus and stomach without difficulty and multiple views were obtained.  The patient was kept under observation until the patient left the procedure room.   I was present face-to-face 100% of this time. The patient left the procedure room in stable condition.   Agitated microbubble saline contrast was not administered.  COMPLICATIONS:    There were no immediate complications.  Findings:  1. LEFT VENTRICLE: The left ventricular wall thickness is normal.  The left ventricular cavity is normal in size. Wall motion is normal.  LVEF is 60-65%.  2. RIGHT VENTRICLE:  The right ventricle is normal in structure and function without any thrombus or masses.    3. LEFT ATRIUM:  The left atrium is normal in size without any thrombus or masses.  There is not spontaneous echo contrast ("smoke") in the left atrium consistent with a low flow state.  4. LEFT ATRIAL APPENDAGE:  The left atrial appendage is free of any thrombus or masses. The appendage has single lobes. Pulse doppler indicates moderate flow in the appendage.  5. ATRIAL SEPTUM:  The atrial septum appears intact and is free of thrombus and/or masses.  There is no evidence for interatrial shunting by color  doppler and saline microbubble.  6. RIGHT ATRIUM:  The right atrium is normal in size and function without any thrombus or masses.  7. MITRAL VALVE:  The mitral valve is sclerotic and there is a partially ruptured cord of the AMVL, there is associated Mild regurgitation.  There were no active vegetations, however, the valve appears to have had sequelae of prior infection.  8. AORTIC VALVE:  The aortic valve is trileaflet and sclerotic with some annular calcification and no regurgitation. Lambl's excrescences (normal variant) see on the valve leaflet tips. There were no vegetations or stenosis  9. TRICUSPID VALVE:  The tricuspid valve is normal in structure and function with trivial regurgitation.  There were no vegetations or stenosis  10.  PULMONIC VALVE:  The pulmonic valve is normal in structure and function with no regurgitation.  There were no vegetations or stenosis.   11. AORTIC ARCH, ASCENDING AND DESCENDING AORTA:  There was grade 2 Ron Parker et. Al, 1992) atherosclerosis of the ascending aorta, aortic arch, or proximal descending aorta.  12. PULMONARY VEINS: Anomalous pulmonary venous return was not noted.  13. PERICARDIUM: The pericardium appeared normal and non-thickened.  There is no pericardial effusion.  IMPRESSION:   1. No active endocarditis 2. Sclerotic mitral valve with partially ruptured cord to the AMVL, mild MR 3. No LAA thrombus 4. Negative for PFO by color doppler 5. Normal biatrial size 6. LVEF 60-65%  RECOMMENDATIONS:    1.  Antibiotics per ID recommendations for bacteremia.  Time Spent Directly with the Patient:  45 minutes   Pixie Casino, MD, [facility], [place] Director of  the [facility]  [facility] Diplomate of the American Board of Clinical Lipidology Attending Cardiologist  Direct Dial: [phone]  Fax: [phone]  Website:  [email or web address]  Tiersa Dayley 12/04/2019, 10:12 AM

## 2019-12-04 NOTE — H&P (Signed)
   INTERVAL PROCEDURE H&P  History and Physical Interval Note:  12/04/2019 9:35 AM  Arta Silence has presented today for their planned procedure. The various methods of treatment have been discussed with the patient and family. After consideration of risks, benefits and other options for treatment, the patient has consented to the procedure.  The patients' outpatient history has been reviewed, patient examined, and no change in status from most recent office note within the past 30 days. I have reviewed the patients' chart and labs and will proceed as planned. Questions were answered to the patient's satisfaction.   Pixie Casino, MD, [facility], [place] Director of the [facility] Lip[facility] &  Cardiovascular Risk Reduction Clinic Diplomate of the American Board of Clinical Lipidology Attending Cardiologist  Direct Dial: [phone]  Fax: [phone]  Website:  [email or web address] 12/04/2019, 9:35 AM

## 2019-12-04 NOTE — Progress Notes (Signed)
Pharmacy Antibiotic Note  Carlos Hamilton is a 64 y.o. male admitted on 12/01/2019 with sepsis.  Pharmacy has been consulted for Cefepime and Vancomycin dosing.   Height: 6\' 1"  (185.4 cm) Weight: 209 lb 14.1 oz (95.2 kg) IBW/kg (Calculated) : 79.9  Temp (24hrs), Avg:98.7 F (37.1 C), Min:98.5 F (36.9 C), Max:99.2 F (37.3 C)  Recent Labs  Lab 12/01/19 1714 12/01/19 1721 12/01/19 1733 12/01/19 2349 12/02/19 0336 12/03/19 0446 12/04/19 0503  WBC 5.7  --   --   --  5.2 4.2 4.4  CREATININE 6.17*  --  6.40*  --  7.17* 6.03* 8.48*  LATICACIDVEN  --  2.7*  --  1.4  --   --   --     Estimated Creatinine Clearance: 10.1 mL/min (A) (by C-G formula based on SCr of 8.48 mg/dL (H)).    No Known Allergies  Antimicrobials this admission: 1/12 Cefepime >>1/13  1/12 Vancomycin >>   Dose adjustments this admission:   Microbiology results: 1/12 BCx: MRSA   Plan:  - Vancomycin 750mg  IV q TTS with HD  - Monitor cultures and HD schedule   Shadoe Cryan A. Levada Dy, PharmD, BCPS, FNKF Clinical Pharmacist [place] Please utilize Amion for appropriate phone number to reach the unit pharmacist ([place])   12/04/2019 8:23 AM

## 2019-12-05 LAB — COMPREHENSIVE METABOLIC PANEL
ALT: 41 U/L (ref 0–44)
AST: 49 U/L — ABNORMAL HIGH (ref 15–41)
Albumin: 1.8 g/dL — ABNORMAL LOW (ref 3.5–5.0)
Alkaline Phosphatase: 216 U/L — ABNORMAL HIGH (ref 38–126)
Anion gap: 14 (ref 5–15)
BUN: 88 mg/dL — ABNORMAL HIGH (ref 8–23)
CO2: 20 mmol/L — ABNORMAL LOW (ref 22–32)
Calcium: 8.1 mg/dL — ABNORMAL LOW (ref 8.9–10.3)
Chloride: 96 mmol/L — ABNORMAL LOW (ref 98–111)
Creatinine, Ser: 10.08 mg/dL — ABNORMAL HIGH (ref 0.61–1.24)
GFR calc Af Amer: 6 mL/min — ABNORMAL LOW (ref >60)
GFR calc non Af Amer: 5 mL/min — ABNORMAL LOW (ref >60)
Glucose, Bld: 150 mg/dL — ABNORMAL HIGH (ref 70–99)
Potassium: 4.1 mmol/L (ref 3.5–5.1)
Sodium: 130 mmol/L — ABNORMAL LOW (ref 135–145)
Total Bilirubin: 1.2 mg/dL (ref 0.3–1.2)
Total Protein: 6.9 g/dL (ref 6.5–8.1)

## 2019-12-05 LAB — CBC
HCT: 29.1 % — ABNORMAL LOW (ref 39.0–52.0)
Hemoglobin: 9.3 g/dL — ABNORMAL LOW (ref 13.0–17.0)
MCH: 25.3 pg — ABNORMAL LOW (ref 26.0–34.0)
MCHC: 32 g/dL (ref 30.0–36.0)
MCV: 79.3 fL — ABNORMAL LOW (ref 80.0–100.0)
Platelets: 121 10*3/uL — ABNORMAL LOW (ref 150–400)
RBC: 3.67 MIL/uL — ABNORMAL LOW (ref 4.22–5.81)
RDW: 16.3 % — ABNORMAL HIGH (ref 11.5–15.5)
WBC: 5 10*3/uL (ref 4.0–10.5)
nRBC: 0 % (ref 0.0–0.2)

## 2019-12-05 LAB — VANCOMYCIN, RANDOM: Vancomycin Rm: 22

## 2019-12-05 LAB — HEPATITIS B CORE ANTIBODY, TOTAL: Hep B Core Total Ab: NONREACTIVE

## 2019-12-05 MED ORDER — BENZONATATE 100 MG PO CAPS
100.0000 mg | ORAL_CAPSULE | Freq: Three times a day (TID) | ORAL | Status: DC | PRN
  Administered 2019-12-06 – 2019-12-08 (×2): 100 mg via ORAL
  Filled 2019-12-05 (×2): qty 1

## 2019-12-05 MED ORDER — VANCOMYCIN HCL IN DEXTROSE 750-5 MG/150ML-% IV SOLN
INTRAVENOUS | Status: AC
  Administered 2019-12-05: 750 mg via INTRAVENOUS
  Filled 2019-12-05: qty 150

## 2019-12-05 MED ORDER — HEPARIN SODIUM (PORCINE) 1000 UNIT/ML IJ SOLN
INTRAMUSCULAR | Status: AC
  Administered 2019-12-05: 2800 [IU] via INTRAVENOUS_CENTRAL
  Filled 2019-12-05: qty 3

## 2019-12-05 MED ORDER — CHLORHEXIDINE GLUCONATE CLOTH 2 % EX PADS
6.0000 | MEDICATED_PAD | Freq: Every day | CUTANEOUS | Status: DC
  Administered 2019-12-05 – 2019-12-08 (×4): 6 via TOPICAL

## 2019-12-05 NOTE — Progress Notes (Signed)
   Subjective: No acute events overnight. [name] denies any acute complaints. He mentions stress regarding insurance and financial issues and would like to speak to a Education officer, museum. We discussed his negative TEE results and that we are waiting on his blood cultures to remain negative prior to placing new TDC.   Objective:  Vital signs in last 24 hours: Vitals:   12/04/19 1723 12/04/19 2115 12/05/19 0441 12/05/19 0900  BP: 98/63 97/63 114/64 105/63  Pulse: 72 80 86 80  Resp: 18 16 20 18   Temp: 98.5 F (36.9 C) 98.4 F (36.9 C) 98.2 F (36.8 C) 97.6 F (36.4 C)  TempSrc:  Oral Oral Oral  SpO2: 98% 98% 97% 97%  Weight:  99 kg    Height:       General: awake, alert, lying comfortably in bed in NAD CV: RRR; no m/r/g Pulm: normal work of breathing; lungs CTAB Ext: stable charcot deformity of right foot Skin: chronic venous stasis changes on bilateral shins    Assessment/Plan:  Principal Problem:   MRSA bacteremia Active Problems:   Sepsis (HCC)   ESRD on dialysis ([place])   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 Hamilton is a 64 year old M with significant PMH of hepatorenal syndrome, ESRD on dialysis TTS, diabetes mellitus, and bell's palsy, who presented with AMS, fever,hypotension, and an elevated lactate with work-up significant for sepsis secondary to MRSA bacteremia.  MRSA bacteremia Pt has remained afebrile for >48hr and without leukocytosis. Blood cultures from 1/12 positive for MRSA on BCID in 4/4 bottles. Suspect right sided TDC is the infectious source.  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 No signs of hepatic encephalopathy or significant ascites. - blood work remains stable - continue home midodrine 10mg  TID  Diet-controlled DM  Hemoglobin A1c 6.1 on 11/26/2019 - not currently on any medications  [facility] consulted,appreciate their input - discussed non-operative treatments vs transtibial amputation on the right - pt considering his options and plan for follow-up with Dr. Sharol Given outpatient in 1 week.  Dispo: Anticipated discharge in approximately 2-3 day(s).   Carlos Hamilton D, DO 12/05/2019, 10:59 AM Pager: [phone]

## 2019-12-05 NOTE — Progress Notes (Signed)
Pharmacy Antibiotic Note  Carlos Hamilton is a 64 y.o. male admitted on 12/01/2019 with sepsis.  Pharmacy has been consulted for Vancomycin dosing.  Vancomycin random level: 22 (Within goal of 15 - 25)  Plan: Continue Vancomycin 750 mg qTThS with HD sessions F/u renal plans, C&S, clinical status and pre-HD vanc levels PRN   Height: 6' (182.9 cm) Weight: 218 lb 4.1 oz (99 kg) IBW/kg (Calculated) : 77.6  Temp (24hrs), Avg:98.1 F (36.7 C), Min:97.6 F (36.4 C), Max:98.5 F (36.9 C)  Recent Labs  Lab 12/01/19 1714 12/01/19 1714 12/01/19 1721 12/01/19 1733 12/01/19 2349 12/02/19 0336 12/03/19 0446 12/04/19 0503 12/05/19 0705  WBC 5.7  --   --   --   --  5.2 4.2 4.4 5.0  CREATININE 6.17*   < >  --  6.40*  --  7.17* 6.03* 8.48* 10.08*  LATICACIDVEN  --   --  2.7*  --  1.4  --   --   --   --   VANCORANDOM  --   --   --   --   --   --   --   --  22   < > = values in this interval not displayed.    Estimated Creatinine Clearance: 9.1 mL/min (A) (by C-G formula based on SCr of 10.08 mg/dL (H)).    No Known Allergies  Antimicrobials this admission: 1/12 Cefepime x1 1/12 Vancomycin >> (12/23/2019 per ID)   Microbiology results: 1/12 BCx: MRSA   Acey Lav, PharmD  PGY1 Acute Care Pharmacy Resident Please utilize Amion for appropriate phone number to reach the unit pharmacist ([place])   12/05/2019 10:36 AM

## 2019-12-05 NOTE — Progress Notes (Signed)
Subjective:  S/p vascath placed per IR last night- for HD today -  Strange affect   Objective Vital signs in last 24 hours: Vitals:   12/04/19 1723 12/04/19 2115 12/05/19 0441 12/05/19 0900  BP: 98/63 97/63 114/64 105/63  Pulse: 72 80 86 80  Resp: 18 16 20 18   Temp: 98.5 F (36.9 C) 98.4 F (36.9 C) 98.2 F (36.8 C) 97.6 F (36.4 C)  TempSrc:  Oral Oral Oral  SpO2: 98% 98% 97% 97%  Weight:  99 kg    Height:       Weight change: 2.323 kg  Intake/Output Summary (Last 24 hours) at 12/05/2019 0948 Last data filed at 12/05/2019 0800 Gross per 24 hour  Intake 0 ml  Output 0 ml  Net 0 ml   Dialysis Orders: Center: [facility]- TTS 3 hr 45 min 97 kg (214 lbs) 3.0K/2.0 Ca 400/800 RIJ TDC -No heparin -Aranesp 75 mcg IV weekly due 12/03/19 Last hgb 9.9   Assessment/Plan: 1. Sepsis D/T MRSA Bacteremia-seen by ID. MRI RLE negative for osteo, believe bacteremia related to tunneled dialysis catheter infection.  TDC removed by IR on 1/14. s/p temporary HD catheter placed 1/15 until final BC results available. If clear, can have TDC replaced in IR next week. Echo reveals no obvious endocarditis. Will need 6 weeks of Vanc IV per ID recs. Per primary/ID- this can be given at the OP unit.  Will plan next HD today to keep on reg HD schedule 2. 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 AST/ALT elevated. Per primary 3. R Charcot foot-Ortho consulted. Per primary. 4.  ESRD -  Usually T,Th,S.  HD off schedule Wed night. 2.0 K bath, no heparin. Next tentatively planned for today to keep on schedule.  I did tell him that this is the argument for permanent access-  Apparently has been refusing as OP   5.  Hypertension/volume  - BP on soft side. On midodrine 10 mg PO TID. UF as tolerated. Does not seem overtly volume overloaded  6.  Anemia  -HGB 8.5 down from 9.9 last week. Aranesp given, increase dose to 100 mcg IV. No acute blood loss reported.  is stable 7.  Metabolic bone  disease - On renvela, no VDRA.  8.  Nutrition - Albumin low. Renal diet, prostat, renal vits.   Renal will not physically see tomorrow, will revisit on Monday   [place]: Basic Metabolic Panel: Recent Labs  Lab 12/03/19 0446 12/04/19 0503 12/05/19 0705  NA 132* 129* 130*  K 3.7 3.8 4.1  CL 95* 95* 96*  CO2 22 21* 20*  GLUCOSE 124* 138* 150*  BUN 49* 70* 88*  CREATININE 6.03* 8.48* 10.08*  CALCIUM 8.0* 8.2* 8.1*   Liver Function Tests: Recent Labs  Lab 12/03/19 0446 12/04/19 0503 12/05/19 0705  AST 106* 92* 49*  ALT 57* 56* 41  ALKPHOS 229* 252* 216*  BILITOT 1.0 1.1 1.2  PROT 6.8 6.7 6.9  ALBUMIN 1.8* 1.8* 1.8*   No results for input(s): LIPASE, AMYLASE in the last 168 hours. Recent Labs  Lab 12/02/19 0031  AMMONIA 35   CBC: Recent Labs  Lab 12/01/19 1714 12/01/19 1733 12/02/19 0336 12/02/19 0336 12/03/19 0446 12/04/19 0503 12/05/19 0705  WBC 5.7   < > 5.2   < > 4.2 4.4 5.0  NEUTROABS 4.5  --   --   --   --   --   --   HGB  9.8*   < > 8.5*   < > 8.7* 9.4* 9.3*  HCT 31.9*   < > 27.9*   < > 28.1* 30.2* 29.1*  MCV 82.6  --  82.8  --  81.4 81.2 79.3*  PLT 86*   < > 80*   < > 68* 86* 121*   < > = values in this interval not displayed.   Cardiac Enzymes: No results for input(s): CKTOTAL, CKMB, CKMBINDEX, TROPONINI in the last 168 hours. CBG: Recent Labs  Lab 12/01/19 1925  GLUCAP 114*    Iron Studies: No results for input(s): IRON, TIBC, TRANSFERRIN, FERRITIN in the last 72 hours. Studies/Results: IR Fluoro Guide CV Line Right  Result Date: 12/04/2019 INDICATION: 64 year old male with end-stage renal disease on hemodialysis. He recently had a tunneled hemodialysis catheter removed for bacteremia and presents for placement of a non tunneled temporary catheter to allow hemodialysis. EXAM: IR RIGHT FLUORO GUIDE CV LINE; IR ULTRASOUND GUIDANCE VASC ACCESS RIGHT MEDICATIONS: None. ANESTHESIA/SEDATION: None. FLUOROSCOPY TIME:   Fluoroscopy Time: 2 minutes 30 seconds (9 mGy). COMPLICATIONS: None immediate. PROCEDURE: Informed written consent was obtained from the patient after a thorough discussion of the procedural risks, benefits and alternatives. All questions were addressed. Maximal Sterile Barrier Technique was utilized including caps, mask, sterile gowns, sterile gloves, sterile drape, hand hygiene and skin antiseptic. A timeout was performed prior to the initiation of the procedure. The right internal jugular vein was interrogated with ultrasound and found to be widely patent. An image was obtained and stored for the medical record. Local anesthesia was attained by infiltration with 1% lidocaine. A small dermatotomy was made. Under real-time sonographic guidance, the vessel was punctured with a 21 gauge micropuncture needle. Using standard technique, the initial micro needle was exchanged over a 0.018 micro wire for a transitional 4 [place] micro sheath. The micro sheath was then exchanged over a 0.035 wire for a fascial dilator. The soft tissue tract was then dilated. A 20 cm triple-lumen temporary hemodialysis catheter was then advanced over the wire and positioned with the catheter tip in the upper right atrium. Catheter flushes and aspirates easily. The catheter was flushed with heparinized saline, capped and secured to the skin with 0 Prolene suture. A sterile bandage was applied. IMPRESSION: Successful placement of a 20 cm non tunneled hemodialysis catheter via the right internal jugular vein. The catheter tip is in the upper right atrium and the catheter is ready for immediate use. Signed, Criselda Peaches, MD, [facility] Radiology Electronically Signed   By: Jacqulynn Cadet M.D.   On: 12/04/2019 16:22   IR Removal Tun Cv Cath W/O FL  Result Date: 12/03/2019 INDICATION: End-stage renal disease. Bacteremia. Request removal of tunneled hemodialysis catheter. EXAM:  REMOVAL OF TUNNELED RIGHT IJ HEMODIALYSIS CATHETER MEDICATIONS: None COMPLICATIONS: None immediate. PROCEDURE: Informed written consent was obtained from the patient following an explanation of the procedure, risks, benefits and alternatives to treatment. A time out was performed prior to the initiation of the procedure. Maximal barrier sterile technique was utilized including caps, mask, sterile gowns, sterile gloves, large sterile drape, hand hygiene, and chlorhexidine. 1% lidocaine was injected under sterile conditions along the subcutaneous tunnel. Utilizing a combination of blunt dissection and gentle traction, the cuff of the catheter was exposed and the catheter was removed intact. Hemostasis was obtained with manual compression. A dressing was placed. The patient tolerated the procedure well without immediate post procedural complication. IMPRESSION: Successful removal of tunneled right  IJ dialysis catheter. Read by: Ascencion Dike PA-C Electronically Signed   By: Jerilynn Mages.  Shick M.D.   On: 12/03/2019 12:06   IR US Guide Vasc Access Right  Result Date: 12/04/2019 INDICATION: 64 year old male with end-stage renal disease on hemodialysis. He recently had a tunneled hemodialysis catheter removed for bacteremia and presents for placement of a non tunneled temporary catheter to allow hemodialysis. EXAM: IR RIGHT FLUORO GUIDE CV LINE; IR ULTRASOUND GUIDANCE VASC ACCESS RIGHT MEDICATIONS: None. ANESTHESIA/SEDATION: None. FLUOROSCOPY TIME:  Fluoroscopy Time: 2 minutes 30 seconds (9 mGy). COMPLICATIONS: None immediate. PROCEDURE: Informed written consent was obtained from the patient after a thorough discussion of the procedural risks, benefits and alternatives. All questions were addressed. Maximal Sterile Barrier Technique was utilized including caps, mask, sterile gowns, sterile gloves, sterile drape, hand hygiene and skin antiseptic. A timeout was performed prior to the initiation of the procedure. The right  internal jugular vein was interrogated with ultrasound and found to be widely patent. An image was obtained and stored for the medical record. Local anesthesia was attained by infiltration with 1% lidocaine. A small dermatotomy was made. Under real-time sonographic guidance, the vessel was punctured with a 21 gauge micropuncture needle. Using standard technique, the initial micro needle was exchanged over a 0.018 micro wire for a transitional 4 [place] micro sheath. The micro sheath was then exchanged over a 0.035 wire for a fascial dilator. The soft tissue tract was then dilated. A 20 cm triple-lumen temporary hemodialysis catheter was then advanced over the wire and positioned with the catheter tip in the upper right atrium. Catheter flushes and aspirates easily. The catheter was flushed with heparinized saline, capped and secured to the skin with 0 Prolene suture. A sterile bandage was applied. IMPRESSION: Successful placement of a 20 cm non tunneled hemodialysis catheter via the right internal jugular vein. The catheter tip is in the upper right atrium and the catheter is ready for immediate use. Signed, Criselda Peaches, MD, [place] [facility] Radiology Electronically Signed   By: Jacqulynn Cadet M.D.   On: 12/04/2019 16:22   ECHO TEE  Result Date: 12/04/2019   TRANSESOPHOGEAL ECHO REPORT   Patient Name:   Carlos Hamilton Date of Exam: 12/04/2019 Medical Rec #:  [id number]  Height:       73.0 in Accession #:    [id number] Weight:       209.9 lb Date of Birth:  1956/10/23  BSA:          2.20 m Patient Age:    2 years   BP:           101/59 mmHg Patient Gender: M          HR:           69 bpm. Exam Location:  Inpatient  Procedure: Transesophageal Echo, Cardiac Doppler, 3D Echo and Color Doppler Indications:     Bacteremia  History:         Patient has prior history of Echocardiogram examinations, most                  recent 12/02/2019. ESRD, cirrhosis, Sepsis.   Sonographer:     Dustin Flock Referring Phys:  [place] Diagnosing Phys: Lyman Bishop MD  PROCEDURE: Patients was monitored while under deep sedation. The transesophogeal probe was passed through the esophogus of the patient. Imaged were obtained with the patient in a supine position. Image quality was excellent. The patient's vital signs;  including heart rate, blood pressure, and oxygen saturation; remained stable throughout the procedure. The patient developed no complications during the procedure. IMPRESSIONS  1. Left ventricular ejection fraction, by visual estimation, is 60 to 65%. The left ventricle has normal function. There is no left ventricular hypertrophy.  2. The left ventricle has no regional wall motion abnormalities.  3. Global right ventricle has normal systolic function.The right ventricular size is normal. No increase in right ventricular wall thickness.  4. Left atrial size was normal.  5. Right atrial size was normal.  6. Mild flail of anterior leaflet.  7. The mitral valve is degenerative. Mild mitral valve regurgitation.  8. The tricuspid valve is grossly normal.  9. The aortic valve is tricuspid. Aortic valve regurgitation is not visualized. 10. The pulmonic valve was grossly normal. Pulmonic valve regurgitation is not visualized. 11. No endocarditis FINDINGS  Left Ventricle: Left ventricular ejection fraction, by visual estimation, is 60 to 65%. The left ventricle has normal function. The left ventricle has no regional wall motion abnormalities. There is no left ventricular hypertrophy. Right Ventricle: The right ventricular size is normal. No increase in right ventricular wall thickness. Global RV systolic function is has normal systolic function. Left Atrium: Left atrial size was normal in size. Right Atrium: Right atrial size was normal in size Pericardium: There is no evidence of pericardial effusion. Mitral Valve: The mitral valve is degenerative in appearance. Mild  flail of the medial segment of the anterior mitral valve leaflet. There is mild thickening of the mitral valve leaflet(s). There is mild calcification of the mitral valve leaflet(s). There is mild chordal rupture of the anterior leaflet of the mitral valve. Mild mitral valve regurgitation. Tricuspid Valve: The tricuspid valve is grossly normal. Tricuspid valve regurgitation is trivial. Aortic Valve: The aortic valve is tricuspid. Aortic valve regurgitation is not visualized. Pulmonic Valve: The pulmonic valve was grossly normal. Pulmonic valve regurgitation is not visualized. Aorta: The aortic root and ascending aorta are structurally normal, with no evidence of dilitation. Shunts: No atrial level shunt detected by color flow Doppler.  Lyman Bishop MD Electronically signed by Lyman Bishop MD Signature Date/Time: 12/04/2019/3:22:54 PM    Final    Medications: Infusions: . sodium chloride    . sodium chloride    . vancomycin 750 mg (12/03/19 1107)    Scheduled Medications: . Chlorhexidine Gluconate Cloth  6 each Topical Q0600  . Chlorhexidine Gluconate Cloth  6 each Topical Q0600  . feeding supplement (PRO-STAT SUGAR FREE 64)  30 mL Oral BID  . heparin  5,000 Units Subcutaneous Q8H  . midodrine  10 mg Oral TID AC  . multivitamin  1 tablet Oral QHS  . pantoprazole  40 mg Oral Daily  . sevelamer carbonate  800 mg Oral TID WC  . sodium chloride flush  3 mL Intravenous Q12H    have reviewed scheduled and prn medications.  Physical Exam: General: lying flat, NAD Heart: RRR Lungs: mostly clear Abdomen: slightly distended-  ascites Extremities: min dep edema Dialysis Access: new right vascath    12/05/2019,9:48 AM  LOS: 3 days

## 2019-12-05 NOTE — Progress Notes (Signed)
Patient complaining of cough. MD notified and stated that she would place orders. Awaiting orders.

## 2019-12-06 LAB — CBC
HCT: 29.3 % — ABNORMAL LOW (ref 39.0–52.0)
Hemoglobin: 9.1 g/dL — ABNORMAL LOW (ref 13.0–17.0)
MCH: 25.5 pg — ABNORMAL LOW (ref 26.0–34.0)
MCHC: 31.1 g/dL (ref 30.0–36.0)
MCV: 82.1 fL (ref 80.0–100.0)
Platelets: 136 10*3/uL — ABNORMAL LOW (ref 150–400)
RBC: 3.57 MIL/uL — ABNORMAL LOW (ref 4.22–5.81)
RDW: 16.4 % — ABNORMAL HIGH (ref 11.5–15.5)
WBC: 7.1 10*3/uL (ref 4.0–10.5)
nRBC: 0 % (ref 0.0–0.2)

## 2019-12-06 LAB — HEPATITIS B E ANTIGEN: Hep B E Ag: NEGATIVE

## 2019-12-06 NOTE — Progress Notes (Signed)
   Subjective: No acute events overnight. Dialysis went well yesterday. Pt repeating questions regarding treatment plan and is fixated on outpatient follow-up with nephrology, hepatology, and orthopedics. All questions and concerns addressed. Denies any new back or joint pain, chest pain, shortness of breath, or other acute concerns at this time.   Objective:  Vital signs in last 24 hours: Vitals:   12/05/19 1633 12/05/19 1746 12/05/19 2000 12/06/19 0426  BP: 114/71 126/77 (!) 99/59 108/61  Pulse: 77 81 88 75  Resp: 17 20 20 18   Temp: 98 F (36.7 C) 98.1 F (36.7 C) 98.2 F (36.8 C) 98.4 F (36.9 C)  TempSrc: Oral Oral Oral Oral  SpO2: 98% 98% 95% 97%  Weight: 95.1 kg     Height:       Physical Exam Vitals and nursing note reviewed.  Constitutional:      General: He is not in acute distress.    Appearance: He is not ill-appearing.  Cardiovascular:     Rate and Rhythm: Normal rate and regular rhythm.  Pulmonary:     Effort: Pulmonary effort is normal.  Abdominal:     General: Bowel sounds are normal.     Palpations: Abdomen is soft.  Skin:    General: Skin is warm and dry.     Comments: Chronic venous stasis changes of bilateral LE's   Neurological:     Mental Status: He is alert.  Psychiatric:        Mood and Affect: Affect is flat.    Assessment/Plan:  Principal Problem:   MRSA bacteremia Active Problems:   Sepsis ([place])   ESRD on dialysis ([place])   Diabetes mellitus with Charcot's joint arthropathy ([place])   Charcot's joint, right ankle and foot  Mr. Courter is a 64 year old M with significant PMH of hepatorenal syndrome, ESRD on dialysis TTS, diabetes mellitus, and bell's palsy, who presented with AMS, fever,hypotension, and an elevated lactatewith work-up significantfor sepsissecondary toMRSA bacteremia.  MRSA bacteremia Blood cultures from 1/12 positive for MRSA on BCID in 4/4 bottles. Suspect right sided TDC is the infectious source. - TCD removed 1/14  with temp HD cath placement on 1/15  - TEE 1/15 without evidence of endocarditis active endocarditis  - remains afebrile - repeat cultures from 1/13 with no growth in 4 days - repeat cultures from 1/15 after catheter removal on 1/15 with no growth in 2 days - [facility] placement when blood cultures confirmed negative - per ID, continue vanc with HD until 12/23/2019   ESRD Due to hepatorenal Type I. 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 - LFTs stable/down-trending, will continue to monitor - continue home midodrine 10mg  TID  Diet-controlled DM Hemoglobin A1c 6.1 on 11/26/2019 - not currently on any medications  Charcot Foot - PT/OT consulted Orthopedics evaluated pt, discussed non-operative treatments vs transtibial amputation on the right - pt considering his options with plan for follow-up with Dr. Sharol Given outpatient in 1 week.   Diet - renal with 1255mL fluid restriction Fluids -none DVT ppx- heparin 5,000 units subQ q8h CODE STATUS- DNR/DNI   Dispo: Anticipated discharge in approximately 1-2 day(s).   Ladona Horns, MD 12/06/2019, 7:07 AM Pager: [phone]

## 2019-12-07 LAB — HEPATITIS B E ANTIBODY: Hep B E Ab: NEGATIVE

## 2019-12-07 LAB — CULTURE, BLOOD (ROUTINE X 2)
Culture: NO GROWTH
Culture: NO GROWTH
Special Requests: ADEQUATE
Special Requests: ADEQUATE

## 2019-12-07 MED ORDER — HEPARIN SODIUM (PORCINE) 5000 UNIT/ML IJ SOLN
5000.0000 [IU] | Freq: Three times a day (TID) | INTRAMUSCULAR | Status: DC
  Filled 2019-12-07: qty 1

## 2019-12-07 NOTE — Progress Notes (Signed)
Physical Therapy Evaluation Patient Details Name: Carlos Hamilton MRN: [id number] DOB: [birth year] Today's Date: 12/07/2019   History of Present Illness  64 yo male with onset of Sepsis from MRSA bacteremia from HD port.  Pt has been seen prev for infection in R ankle, with charcot foot.  Pt was cleared for endocarditis, has cirrhosis with pt discussing transplant of liver.  PMHx:  DM, Charcot foot RLE, ESRD, cirrhosis liver with EtOH,  hepatorenal syndrome  Clinical Impression  Pt was seen for mobility on RLE and to assess endurance and safety of gait.  He is up for the first time in a week and got to the chair with min assist and mod to transfer.  He is home with his sister and his mother with caregivers, and would prefer to go directly there.  Talked with him about his limitations, and the amount of rehab he has received in past 6 months.  Chronic health issues are his main concern, and will work on resuming more independent gait when able, but for now asking for CIR to give him assist for strength and control of standing mobility. Follow acutely for same.    Follow Up Recommendations CIR    Equipment Recommendations  None recommended by PT    Recommendations for Other Services Rehab consult     Precautions / Restrictions Precautions Precautions: Fall Precaution Comments: monitor BP Restrictions Weight Bearing Restrictions: No      Mobility  Bed Mobility Overal bed mobility: Needs Assistance Bed Mobility: Supine to Sit     Supine to sit: Min guard     General bed mobility comments: min guard for safety as PT was evaluating him  Transfers Overall transfer level: Needs assistance Equipment used: Rolling walker (2 wheeled);1 person hand held assist Transfers: Sit to/from Stand Sit to Stand: Mod assist         General transfer comment: mod to power up from higher surface, but once up is min guard  Ambulation/Gait Ambulation/Gait assistance: Min guard(guarded closely due to  pt feeing light headed sitting initia) Gait Distance (Feet): 5 Feet Assistive device: Rolling walker (2 wheeled);1 person hand held assist Gait Pattern/deviations: Step-to pattern;Step-through pattern;Decreased stride length;Wide base of support;Trunk flexed Gait velocity: reduced Gait velocity interpretation: <1.31 ft/sec, indicative of household ambulator General Gait Details: cues for posture and safety with reaching back to sit, mod assist to control sitting descent  Stairs            Wheelchair Mobility    Modified Rankin (Stroke Patients Only)       Balance Overall balance assessment: Needs assistance Sitting-balance support: Feet supported;Single extremity supported Sitting balance-Leahy Scale: Fair     Standing balance support: Bilateral upper extremity supported;During functional activity Standing balance-Leahy Scale: Poor Standing balance comment: cued posture and control of walker                             Pertinent Vitals/Pain Pain Assessment: No/denies pain    Home Living Family/patient expects to be discharged to:: Private residence Living Arrangements: Other relatives Available Help at Discharge: Family;Available 24 hours/day;Personal care attendant Type of Home: House Home Access: Stairs to enter Entrance Stairs-Rails: Right;Left;Can reach both Entrance Stairs-Number of Steps: 1 Home Layout: Two level;Other (Comment)(has a chair lift) Home Equipment: Walker - 2 wheels;Walker - 4 wheels;Cane - single point;Shower seat;Wheelchair - manual Additional Comments: uses RW at top and bottom of stairs inside with chair lift  Prior Function Level of Independence: Independent with assistive device(s)         Comments: has described caregivers for his mother who is 63 that could help him     Hand Dominance   Dominant Hand: Right    Extremity/Trunk Assessment   Upper Extremity Assessment Upper Extremity Assessment: Overall WFL for  tasks assessed    Lower Extremity Assessment Lower Extremity Assessment: Generalized weakness    Cervical / Trunk Assessment Cervical / Trunk Assessment: Normal  Communication   Communication: No difficulties  Cognition Arousal/Alertness: Awake/alert Behavior During Therapy: WFL for tasks assessed/performed Overall Cognitive Status: Within Functional Limits for tasks assessed                                 General Comments: pt has been to rehab mult times in last 6 months      General Comments General comments (skin integrity, edema, etc.): pt is fatigued with effort to stand and pull up straight, with RW could take steps but is slowed down by LE weakness and protective footwear    Exercises     Assessment/Plan    PT Assessment Patient needs continued PT services  PT Problem List Decreased strength;Decreased range of motion;Decreased activity tolerance;Decreased balance;Decreased mobility;Decreased coordination;Decreased safety awareness;Cardiopulmonary status limiting activity;Decreased skin integrity       PT Treatment Interventions DME instruction;Stair training;Gait training;Functional mobility training;Therapeutic activities;Therapeutic exercise;Balance training;Neuromuscular re-education;Patient/family education    PT Goals (Current goals can be found in the Care Plan section)  Acute Rehab PT Goals Patient Stated Goal: to go directly home if possibel PT Goal Formulation: With patient Time For Goal Achievement: 12/21/19 Potential to Achieve Goals: Good    Frequency Min 3X/week   Barriers to discharge Inaccessible home environment;Decreased caregiver support home has a step to enter and has caregivers but are for his mother    Co-evaluation               AM-PAC PT "6 Clicks" Mobility  Outcome Measure Help needed turning from your back to your side while in a flat bed without using bedrails?: A Little Help needed moving from lying on your  back to sitting on the side of a flat bed without using bedrails?: A Little Help needed moving to and from a bed to a chair (including a wheelchair)?: A Little Help needed standing up from a chair using your arms (e.g., wheelchair or bedside chair)?: A Lot Help needed to walk in hospital room?: A Little Help needed climbing 3-5 steps with a railing? : A Lot 6 Click Score: 16    End of Session Equipment Utilized During Treatment: Gait belt Activity Tolerance: Patient limited by fatigue;Treatment limited secondary to medical complications (Comment) Patient left: in chair;with call bell/phone within reach;with chair alarm set Nurse Communication: Mobility status PT Visit Diagnosis: Unsteadiness on feet (R26.81);Muscle weakness (generalized) (M62.81);Difficulty in walking, not elsewhere classified (R26.2);Dizziness and giddiness (R42)    Time: [id number] PT Time Calculation (min) (ACUTE ONLY): 43 min   Charges:   PT Evaluation $PT Eval Moderate Complexity: 1 Mod PT Treatments $Therapeutic Exercise: 8-22 mins $Therapeutic Activity: 8-22 mins        [name] 12/07/2019, 1:00 PM   [name], PT MS Acute Rehab Dept. Number: [place] and [place]

## 2019-12-07 NOTE — Plan of Care (Signed)
  Problem: Education: Goal: Knowledge of General Education information will improve Description: Including pain rating scale, medication(s)/side effects and non-pharmacologic comfort measures Outcome: Completed/Met   Problem: Health Behavior/Discharge Planning: Goal: Ability to manage health-related needs will improve Outcome: Completed/Met   Problem: Clinical Measurements: Goal: Ability to maintain clinical measurements within normal limits will improve Outcome: Completed/Met Goal: Diagnostic test results will improve Outcome: Completed/Met Goal: Respiratory complications will improve Outcome: Completed/Met Goal: Cardiovascular complication will be avoided Outcome: Completed/Met

## 2019-12-07 NOTE — H&P (Signed)
Chief Complaint: Renal failure  Referring Physician(s):  Izell , MD  Supervising Physician: Markus Daft  Patient Status: [facility] - In-pt  History of Present Illness: Carlos Hamilton is a 64 y.o. male with long standing end stage renal disease.  He has been hospitalized for bacteremia, presumably from his previous tunneled HD catheter.  He is known to our service.  He had removal of his existing tunneled catheter on 12/03/19 then placement of a temporary catheter on 12/04/19.  Blood cultures are now negative x 2 and we are asked to place a new tunneled HD catheter.  Other medical issues include hepatic cirrhosis and Charcot foot.   Past Medical History:  Diagnosis Date  . Bell's palsy   . Diabetes ([place])   . Hypercholesteremia   . Hypersomnia   . OSA (obstructive sleep apnea)     Past Surgical History:  Procedure Laterality Date  . IR FLUORO GUIDE CV LINE RIGHT  12/04/2019  . IR REMOVAL TUN CV CATH W/O FL  12/03/2019  . IR US GUIDE VASC ACCESS RIGHT  12/04/2019  . TEE WITHOUT CARDIOVERSION N/A 12/04/2019   Procedure: TRANSESOPHAGEAL ECHOCARDIOGRAM (TEE) with propofol;  Surgeon: Pixie Casino, MD;  Location: [facility] ENDOSCOPY;  Service: Cardiovascular;  Laterality: N/A;    Allergies: Patient has no known allergies.  Medications: Prior to Admission medications   Medication Sig Start Date End Date Taking? Authorizing Provider  acetaminophen (TYLENOL) 325 MG tablet Take 650 mg by mouth every 8 (eight) hours as needed for mild pain or headache.    Yes [provider]  Bismuth Tribromoph-Petrolatum (XEROFORM PETROLATUM DRESSING EX) Apply 1 patch topically daily as needed (for wound/dressing changes).    Yes [provider]  Emollient ([employer] MOISTURIZING EXTRA DRY) CREA Take 1 application by mouth See admin instructions. Apply to dry areas one to two times daily 10/14/19  Yes [provider]  midodrine (PROAMATINE) 10 MG tablet Take 10 mg by mouth 3  (three) times daily before meals.    Yes [provider]  sodium hypochlorite (DAKIN'S 1/2 STRENGTH) external solution Irrigate with 1 application as directed daily as needed (when wound dressings are applied).    Yes [provider]     Family History  Problem Relation Age of Onset  . Cancer Other   . Asthma Other   . Heart disease Other     Social History   Socioeconomic History  . Marital status: Married    Spouse name: Not on file  . Number of children: Not on file  . Years of education: Not on file  . Highest education level: Not on file  Occupational History  . Not on file  Tobacco Use  . Smoking status: Never Smoker  . Smokeless tobacco: Never Used  Substance and Sexual Activity  . Alcohol use: Not Currently  . Drug use: Not Currently  . Sexual activity: Not on file  Other Topics Concern  . Not on file  Social History Narrative  . Not on file   Social Determinants of Health   Financial Resource Strain:   . Difficulty of Paying Living Expenses: Not on file  Food Insecurity:   . Worried About Charity fundraiser in the Last Year: Not on file  . Ran Out of Food in the Last Year: Not on file  Transportation Needs:   . Lack of Transportation (Medical): Not on file  . Lack of Transportation (Non-Medical): Not on file  Physical Activity:   .  Days of Exercise per Week: Not on file  . Minutes of Exercise per Session: Not on file  Stress:   . Feeling of Stress : Not on file  Social Connections:   . Frequency of Communication with Friends and Family: Not on file  . Frequency of Social Gatherings with Friends and Family: Not on file  . Attends Religious Services: Not on file  . Active Member of Clubs or Organizations: Not on file  . Attends Archivist Meetings: Not on file  . Marital Status: Not on file    Review of Systems  Vital Signs: BP 98/63 (BP Location: Left Arm)   Pulse 81   Temp 97.9 F (36.6 C) (Oral)   Resp 20   Ht 6'  (1.829 m)   Wt 95.1 kg   SpO2 96%   BMI 28.43 kg/m   Physical Exam Vitals reviewed.  Constitutional:      Appearance: Normal appearance.  HENT:     Head: Normocephalic and atraumatic.  Eyes:     Extraocular Movements: Extraocular movements intact.  Cardiovascular:     Rate and Rhythm: Normal rate and regular rhythm.  Pulmonary:     Effort: Pulmonary effort is normal. No respiratory distress.     Breath sounds: Normal breath sounds.  Abdominal:     General: There is no distension.     Palpations: Abdomen is soft.     Tenderness: There is no abdominal tenderness.  Musculoskeletal:        General: Normal range of motion.     Cervical back: Normal range of motion.  Skin:    General: Skin is warm and dry.  Neurological:     General: No focal deficit present.     Mental Status: He is alert and oriented to person, place, and time.  Psychiatric:        Mood and Affect: Mood normal.        Behavior: Behavior normal.        Thought Content: Thought content normal.        Judgment: Judgment normal.     Imaging: CT Head Wo Contrast  Result Date: 12/01/2019 CLINICAL DATA:  64 year old male with altered mental status since leaving dialysis at 1500 hours today. Fever at presentation. EXAM: CT HEAD WITHOUT CONTRAST TECHNIQUE: Contiguous axial images were obtained from the base of the skull through the vertex without intravenous contrast. COMPARISON:  None. FINDINGS: Brain: Cerebral volume is within normal limits for age. No midline shift, ventriculomegaly, mass effect, evidence of mass lesion, intracranial hemorrhage or evidence of cortically based acute infarction. Largely normal for age gray-white matter differentiation throughout the brain, although there is a small age indeterminate hypodensity in the right basal ganglia on series 3, image 14. No cortical encephalomalacia identified. Vascular: Calcified atherosclerosis at the skull base. No suspicious intracranial vascular hyperdensity.  Skull: Negative. Sinuses/Orbits: Visualized paranasal sinuses and mastoids are clear. Other: No acute orbit or scalp soft tissue findings. Scalp vessel calcified atherosclerosis. IMPRESSION: 1. Small age indeterminate lacunar infarct in the right basal ganglia. If acutely symptomatic this would result in left side symptoms. 2. Otherwise normal for age noncontrast CT appearance of the brain. Electronically Signed   By: Genevie Ann M.D.   On: 12/01/2019 19:52   MR FOOT RIGHT WO CONTRAST  Result Date: 12/02/2019 CLINICAL DATA:  Right foot swelling, fever and tachycardia end-stage renal disease EXAM: MRI OF THE RIGHT FOREFOOT WITHOUT CONTRAST TECHNIQUE: Multiplanar, multisequence MR imaging of the right was  performed. No intravenous contrast was administered. COMPARISON:  June 19, 2019 FINDINGS: Bones/Joint/Cartilage There is interval progression in the midfoot Charcot arthropathy. There is fragmentation and destruction of the navicular as well as the base of the metatarsals diffusely. There is also interval progression of cortical destruction of the anterior calcaneus with diffusely increased marrow signal and cystic changes. There is inferior subluxation of the talus with diffusely increased T2 hyperintense signal. There is diffuse T2 hyperintense signal seen through the distal tibia and fibula as well. Increased marrow signal seen through the metatarsals. 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 Heterogeneous non loculated fluid seen within the midfoot. Ligaments There is chronic complete disruption of the Lisfranc ligaments. Suboptimal visualization of the remainder of the ligaments. The plantar fascia is thickened and intact. Muscles and Tendons Diffuse fatty atrophy of the muscles surrounding the foot. There is heterogeneous fluid surrounding the peroneal tendons. The Achilles tendon appears to be intact. The distal portions of the flexor extensor tendons appear to be intact. Soft tissues Diffuse dorsal  subcutaneous edema seen surrounding the midfoot. No definite focal ulceration or loculated fluid collection. No sinus tract is seen. 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 Diffuse marrow signal changes seen throughout the midfoot and hindfoot which are likely due to acute Charcot arthropathy, not thought to be due to acute osteomyelitis without overlying skin ulceration or sinus tract. Electronically Signed   By: Prudencio Pair M.D.   On: 12/02/2019 01:56   IR Fluoro Guide CV Line Right  Result Date: 12/04/2019 INDICATION: 64 year old male with end-stage renal disease on hemodialysis. He recently had a tunneled hemodialysis catheter removed for bacteremia and presents for placement of a non tunneled temporary catheter to allow hemodialysis. EXAM: IR RIGHT FLUORO GUIDE CV LINE; IR ULTRASOUND GUIDANCE VASC ACCESS RIGHT MEDICATIONS: None. ANESTHESIA/SEDATION: None. FLUOROSCOPY TIME:  Fluoroscopy Time: 2 minutes 30 seconds (9 mGy). COMPLICATIONS: None immediate. PROCEDURE: Informed written consent was obtained from the patient after a thorough discussion of the procedural risks, benefits and alternatives. All questions were addressed. Maximal Sterile Barrier Technique was utilized including caps, mask, sterile gowns, sterile gloves, sterile drape, hand hygiene and skin antiseptic. A timeout was performed prior to the initiation of the procedure. The right internal jugular vein was interrogated with ultrasound and found to be widely patent. An image was obtained and stored for the medical record. Local anesthesia was attained by infiltration with 1% lidocaine. A small dermatotomy was made. Under real-time sonographic guidance, the vessel was punctured with a 21 gauge micropuncture needle. Using standard technique, the initial micro needle was exchanged over a 0.018 micro wire for a transitional 4 [place] micro sheath. The micro sheath was then  exchanged over a 0.035 wire for a fascial dilator. The soft tissue tract was then dilated. A 20 cm triple-lumen temporary hemodialysis catheter was then advanced over the wire and positioned with the catheter tip in the upper right atrium. Catheter flushes and aspirates easily. The catheter was flushed with heparinized saline, capped and secured to the skin with 0 Prolene suture. A sterile bandage was applied. IMPRESSION: Successful placement of a 20 cm non tunneled hemodialysis catheter via the right internal jugular vein. The catheter tip is in the upper right atrium and the catheter is ready for immediate use. Signed, Criselda Peaches, MD, [facility] Electronically Signed   By: Jacqulynn Cadet M.D.   On: 12/04/2019 16:22   IR  Removal Tun Cv Cath W/O FL  Result Date: 12/03/2019 INDICATION: End-stage renal disease. Bacteremia. Request removal of tunneled hemodialysis catheter. EXAM: REMOVAL OF TUNNELED RIGHT IJ HEMODIALYSIS CATHETER MEDICATIONS: None COMPLICATIONS: None immediate. PROCEDURE: Informed written consent was obtained from the patient following an explanation of the procedure, risks, benefits and alternatives to treatment. A time out was performed prior to the initiation of the procedure. Maximal barrier sterile technique was utilized including caps, mask, sterile gowns, sterile gloves, large sterile drape, hand hygiene, and chlorhexidine. 1% lidocaine was injected under sterile conditions along the subcutaneous tunnel. Utilizing a combination of blunt dissection and gentle traction, the cuff of the catheter was exposed and the catheter was removed intact. Hemostasis was obtained with manual compression. A dressing was placed. The patient tolerated the procedure well without immediate post procedural complication. IMPRESSION: Successful removal of tunneled right IJ dialysis catheter. Read by: Ascencion Dike PA-C Electronically  Signed   By: Jerilynn Mages.  Shick M.D.   On: 12/03/2019 12:06   IR US Guide Vasc Access Right  Result Date: 12/04/2019 INDICATION: 64 year old male with end-stage renal disease on hemodialysis. He recently had a tunneled hemodialysis catheter removed for bacteremia and presents for placement of a non tunneled temporary catheter to allow hemodialysis. EXAM: IR RIGHT FLUORO GUIDE CV LINE; IR ULTRASOUND GUIDANCE VASC ACCESS RIGHT MEDICATIONS: None. ANESTHESIA/SEDATION: None. FLUOROSCOPY TIME:  Fluoroscopy Time: 2 minutes 30 seconds (9 mGy). COMPLICATIONS: None immediate. PROCEDURE: Informed written consent was obtained from the patient after a thorough discussion of the procedural risks, benefits and alternatives. All questions were addressed. Maximal Sterile Barrier Technique was utilized including caps, mask, sterile gowns, sterile gloves, sterile drape, hand hygiene and skin antiseptic. A timeout was performed prior to the initiation of the procedure. The right internal jugular vein was interrogated with ultrasound and found to be widely patent. An image was obtained and stored for the medical record. Local anesthesia was attained by infiltration with 1% lidocaine. A small dermatotomy was made. Under real-time sonographic guidance, the vessel was punctured with a 21 gauge micropuncture needle. Using standard technique, the initial micro needle was exchanged over a 0.018 micro wire for a transitional 4 [place] micro sheath. The micro sheath was then exchanged over a 0.035 wire for a fascial dilator. The soft tissue tract was then dilated. A 20 cm triple-lumen temporary hemodialysis catheter was then advanced over the wire and positioned with the catheter tip in the upper right atrium. Catheter flushes and aspirates easily. The catheter was flushed with heparinized saline, capped and secured to the skin with 0 Prolene suture. A sterile bandage was applied. IMPRESSION: Successful placement of a 20 cm non tunneled hemodialysis  catheter via the right internal jugular vein. The catheter tip is in the upper right atrium and the catheter is ready for immediate use. Signed, Criselda Peaches, MD, [place] [facility] Electronically Signed   By: Jacqulynn Cadet M.D.   On: 12/04/2019 16:22   DG Chest Port 1 View  Result Date: 12/01/2019 CLINICAL DATA:  Altered mental status EXAM: PORTABLE CHEST 1 VIEW COMPARISON:  08/22/2019 FINDINGS: Right dialysis catheter in place with the tip at the cavoatrial junction. Heart is normal size. No confluent opacities or effusions. No acute bony abnormality. IMPRESSION: No active disease. Electronically Signed   By: Rolm Baptise M.D.   On: 12/01/2019 18:31   DG Abd Portable 1V  Result Date: 12/01/2019 CLINICAL DATA:  64 year old male undergoing clearance for MRI. EXAM: PORTABLE ABDOMEN -  1 VIEW COMPARISON:  CT Abdomen and Pelvis 08/24/2019. FINDINGS: No radiopaque foreign body identified. Non obstructed bowel gas pattern. Degenerative changes in the spine. No acute osseous abnormality identified. IMPRESSION: No retained metal foreign body in the abdomen or pelvis to preclude MRI. Electronically Signed   By: Genevie Ann M.D.   On: 12/01/2019 23:25   DG Foot 2 Views Right  Result Date: 12/01/2019 CLINICAL DATA:  Cellulitis. EXAM: RIGHT FOOT - 2 VIEW COMPARISON:  October 05, 2019. FINDINGS: Vascular calcifications are noted. Stable findings consistent with Charcot joint is noted. Status post amputation of second metatarsal and phalanges. There appears to be increased destruction of residual fragment of navicular bone which may represent osteomyelitis. IMPRESSION: Stable findings consistent with Charcot joint. Status post amputation of second metatarsal and phalanges. Increased destruction of residual fragment of navicular bone is noted concerning for osteomyelitis. MRI is recommended for further evaluation. Electronically Signed   By: Marijo Conception M.D.   On: 12/01/2019 18:54   ECHOCARDIOGRAM COMPLETE  Result Date: 12/02/2019   ECHOCARDIOGRAM REPORT   Patient Name:   COMMODORE MONTEMURRO Date of Exam: 12/02/2019 Medical Rec #:  [id number]  Height:       73.0 in Accession #:    [id number] Weight:       218.3 lb Date of Birth:  August 01, 1956  BSA:          2.23 m Patient Age:    42 years   BP:           109/73 mmHg Patient Gender: M          HR:           111 bpm. Exam Location:  Inpatient Procedure: 2D Echo, Cardiac Doppler and Color Doppler Indications:    Bactermia  History:        Patient has no prior history of Echocardiogram examinations.                 Signs/Symptoms:Altered Mental Status and Fever; Risk                 Factors:Diabetes. ESRD, dialysis.  Sonographer:    Dustin Flock Referring Phys: [id number] [place]  1. Left ventricular ejection fraction, by visual estimation, is 60 to 65%. The left ventricle has normal function. There is no left ventricular hypertrophy.  2. Left ventricular diastolic parameters are consistent with Grade I diastolic dysfunction (impaired relaxation).  3. The left ventricle has no regional wall motion abnormalities.  4. Global right ventricle has normal systolic function.The right ventricular size is normal. No increase in right ventricular wall thickness.  5. Left atrial size was normal.  6. Right atrial size was normal.  7. Mild mitral annular calcification.  8. The mitral valve is normal in structure. No evidence of mitral valve regurgitation. No evidence of mitral stenosis.  9. The tricuspid valve is normal in structure. 10. The aortic valve is tricuspid. Aortic valve regurgitation is not visualized. Mild aortic valve sclerosis without stenosis. 11. TR signal is inadequate for assessing pulmonary artery systolic pressure. 12. The inferior vena cava is normal in size with greater than 50% respiratory variability, suggesting right atrial pressure of 3 mmHg. FINDINGS  Left Ventricle: Left ventricular  ejection fraction, by visual estimation, is 60 to 65%. The left ventricle has normal function. The left ventricle has no regional wall motion abnormalities. The left ventricular internal cavity size was the left ventricle is normal in size. There is no left  ventricular hypertrophy. Left ventricular diastolic parameters are consistent with Grade I diastolic dysfunction (impaired relaxation). Right Ventricle: The right ventricular size is normal. No increase in right ventricular wall thickness. Global RV systolic function is has normal systolic function. Left Atrium: Left atrial size was normal in size. Right Atrium: Right atrial size was normal in size Pericardium: There is no evidence of pericardial effusion. Mitral Valve: The mitral valve is normal in structure. Mild mitral annular calcification. No evidence of mitral valve regurgitation. No evidence of mitral valve stenosis by observation. Tricuspid Valve: The tricuspid valve is normal in structure. Tricuspid valve regurgitation is not demonstrated. Aortic Valve: The aortic valve is tricuspid. Aortic valve regurgitation is not visualized. Mild aortic valve sclerosis is present, with no evidence of aortic valve stenosis. Pulmonic Valve: The pulmonic valve was normal in structure. Pulmonic valve regurgitation is not visualized. Pulmonic regurgitation is not visualized. Aorta: The aortic root is normal in size and structure. Venous: The inferior vena cava is normal in size with greater than 50% respiratory variability, suggesting right atrial pressure of 3 mmHg. IAS/Shunts: No atrial level shunt detected by color flow Doppler.  LEFT VENTRICLE PLAX 2D LVIDd:         4.50 cm  Diastology LVIDs:         2.60 cm  LV e' lateral:   11.70 cm/s LV PW:         1.10 cm  LV E/e' lateral: 6.9 LV IVS:        0.90 cm  LV e' medial:    9.46 cm/s LVOT diam:     2.30 cm  LV E/e' medial:  8.6 LV SV:         68 ml LV SV Index:   29.80 LVOT Area:     4.15 cm  RIGHT VENTRICLE RV Basal  diam:  2.90 cm RV S prime:     15.20 cm/s TAPSE (M-mode): 3.4 cm LEFT ATRIUM             Index       RIGHT ATRIUM           Index LA diam:        3.90 cm 1.75 cm/m  RA Area:     23.50 cm LA Vol (A2C):   76.3 ml 34.17 ml/m RA Volume:   73.60 ml  32.96 ml/m LA Vol (A4C):   51.5 ml 23.06 ml/m LA Biplane Vol: 68.9 ml 30.86 ml/m  AORTIC VALVE LVOT Vmax:   88.80 cm/s LVOT Vmean:  60.600 cm/s LVOT VTI:    0.188 m  AORTA Ao Root diam: 3.10 cm MITRAL VALVE MV Area (PHT): 4.21 cm              SHUNTS MV PHT:        52.20 msec            Systemic VTI:  0.19 m MV Decel Time: 180 msec              Systemic Diam: 2.30 cm MV E velocity: 81.00 cm/s  103 cm/s MV A velocity: 103.00 cm/s 70.3 cm/s MV E/A ratio:  0.79        1.5  Loralie Champagne MD Electronically signed by Loralie Champagne MD Signature Date/Time: 12/02/2019/3:43:14 PM    Final    ECHO TEE  Result Date: 12/04/2019   TRANSESOPHOGEAL ECHO REPORT   Patient Name:   LEANDRA CHISUM Date of Exam: 12/04/2019 Medical Rec #:  [id number]  Height:  73.0 in Accession #:    [id number] Weight:       209.9 lb Date of Birth:  1956-04-01  BSA:          2.20 m Patient Age:    50 years   BP:           101/59 mmHg Patient Gender: M          HR:           69 bpm. Exam Location:  Inpatient  Procedure: Transesophageal Echo, Cardiac Doppler, 3D Echo and Color Doppler Indications:     Bacteremia  History:         Patient has prior history of Echocardiogram examinations, most                  recent 12/02/2019. ESRD, cirrhosis, Sepsis.  Sonographer:     Dustin Flock Referring Phys:  [place] Diagnosing Phys: Lyman Bishop MD  PROCEDURE: Patients was monitored while under deep sedation. The transesophogeal probe was passed through the esophogus of the patient. Imaged were obtained with the patient in a supine position. Image quality was excellent. The patient's vital signs; including heart rate, blood pressure, and oxygen saturation; remained stable throughout the procedure.  The patient developed no complications during the procedure. IMPRESSIONS  1. Left ventricular ejection fraction, by visual estimation, is 60 to 65%. The left ventricle has normal function. There is no left ventricular hypertrophy.  2. The left ventricle has no regional wall motion abnormalities.  3. Global right ventricle has normal systolic function.The right ventricular size is normal. No increase in right ventricular wall thickness.  4. Left atrial size was normal.  5. Right atrial size was normal.  6. Mild flail of anterior leaflet.  7. The mitral valve is degenerative. Mild mitral valve regurgitation.  8. The tricuspid valve is grossly normal.  9. The aortic valve is tricuspid. Aortic valve regurgitation is not visualized. 10. The pulmonic valve was grossly normal. Pulmonic valve regurgitation is not visualized. 11. No endocarditis FINDINGS  Left Ventricle: Left ventricular ejection fraction, by visual estimation, is 60 to 65%. The left ventricle has normal function. The left ventricle has no regional wall motion abnormalities. There is no left ventricular hypertrophy. Right Ventricle: The right ventricular size is normal. No increase in right ventricular wall thickness. Global RV systolic function is has normal systolic function. Left Atrium: Left atrial size was normal in size. Right Atrium: Right atrial size was normal in size Pericardium: There is no evidence of pericardial effusion. Mitral Valve: The mitral valve is degenerative in appearance. Mild flail of the medial segment of the anterior mitral valve leaflet. There is mild thickening of the mitral valve leaflet(s). There is mild calcification of the mitral valve leaflet(s). There is mild chordal rupture of the anterior leaflet of the mitral valve. Mild mitral valve regurgitation. Tricuspid Valve: The tricuspid valve is grossly normal. Tricuspid valve regurgitation is trivial. Aortic Valve: The aortic valve is tricuspid. Aortic valve regurgitation is  not visualized. Pulmonic Valve: The pulmonic valve was grossly normal. Pulmonic valve regurgitation is not visualized. Aorta: The aortic root and ascending aorta are structurally normal, with no evidence of dilitation. Shunts: No atrial level shunt detected by color flow Doppler.  Lyman Bishop MD Electronically signed by Lyman Bishop MD Signature Date/Time: 12/04/2019/3:22:54 PM    Final     Labs:  CBC: Recent Labs    12/03/19 0446 12/04/19 0503 12/05/19 0705 12/06/19 0459  WBC 4.2 4.4  5.0 7.1  HGB 8.7* 9.4* 9.3* 9.1*  HCT 28.1* 30.2* 29.1* 29.3*  PLT 68* 86* 121* 136*    COAGS: Recent Labs    12/01/19 1714 12/02/19 0100  INR 1.2 1.1  APTT 35  --     BMP: Recent Labs    12/02/19 0336 12/03/19 0446 12/04/19 0503 12/05/19 0705  NA 132* 132* 129* 130*  K 4.3 3.7 3.8 4.1  CL 97* 95* 95* 96*  CO2 21* 22 21* 20*  GLUCOSE 115* 124* 138* 150*  BUN 57* 49* 70* 88*  CALCIUM 8.0* 8.0* 8.2* 8.1*  CREATININE 7.17* 6.03* 8.48* 10.08*  GFRNONAA 7* 9* 6* 5*  GFRAA 9* 11* 7* 6*    LIVER FUNCTION TESTS: Recent Labs    12/02/19 0336 12/03/19 0446 12/04/19 0503 12/05/19 0705  BILITOT 1.2 1.0 1.1 1.2  AST 154* 106* 92* 49*  ALT 69* 57* 56* 41  ALKPHOS 234* 229* 252* 216*  PROT 7.3 6.8 6.7 6.9  ALBUMIN 1.9* 1.8* 1.8* 1.8*    TUMOR MARKERS: No results for input(s): AFPTM, CEA, CA199, CHROMGRNA in the last 8760 hours.  Assessment and Plan:  ESRD on hemodialysis with recent MRSA bacteremia.  Blood cultures negative.  Will plan for placement of tunneled HD catheter tomorrow afternoon and morning hemodialysis via current temp cath.  Will hold Sub-Q heparin since considered high risk for bleeding due to hepatic cirrhosis. (most recent INR 1.1 on 12/02/19).  Will allow him to have a very early breakfast, NPO after 6:30 am when he will go to dialysis, so he is not NPO all day tomorrow.  Risks and benefits of tunneled HD catheter were discussed with the patient including,  but not limited to bleeding, infection, vascular injury, pneumothorax which may require chest tube placement, air embolism or even death  All of the patient's questions were answered, patient is agreeable to proceed. Consent signed and in chart.  Thank you for this interesting consult.  I greatly enjoyed meeting [employer] and look forward to participating in their care.  A copy of this report was sent to the requesting provider on this date.  Electronically Signed: Murrell Redden, PA-C   12/07/2019, 1:44 PM      I spent a total of   15 Minutes in face to face in clinical consultation, greater than 50% of which was counseling/coordinating care for tunneled HD catheter.

## 2019-12-07 NOTE — Progress Notes (Signed)
Subjective:    B Cx from 1/15 NGTD x2  Has R IJ TEMP cath  No labs today  Long discussion about why TDC as long term option is least good choice  Afebrile, WBC stable/nl   Objective Vital signs in last 24 hours: Vitals:   12/06/19 2048 12/06/19 2123 12/07/19 0510 12/07/19 1014  BP:  106/60 105/71 98/63  Pulse: 69 69 77 81  Resp: 18 20 20 20   Temp:  98.7 F (37.1 C) 98 F (36.7 C) 97.9 F (36.6 C)  TempSrc:  Oral Oral Oral  SpO2: 100% 98% 97% 96%  Weight:      Height:       Weight change:   Intake/Output Summary (Last 24 hours) at 12/07/2019 1427 Last data filed at 12/07/2019 0900 Gross per 24 hour  Intake 360 ml  Output 1 ml  Net 359 ml   Dialysis Orders: Center: [facility]- TTS 3 hr 45 min 97 kg (214 lbs) 3.0K/2.0 Ca 400/800 RIJ TDC -No heparin -Aranesp 75 mcg IV weekly due 12/03/19 Last hgb 9.9   Assessment/Plan: 1. Sepsis D/T MRSA Bacteremia-seen by ID. MRI RLE negative for osteo, believe bacteremia related to tunneled dialysis catheter infection.  TDC removed by IR on 1/14. s/p temporary HD catheter placed 1/15 and now taht BCx neg over weekend will consult IR f or new Tunneled HD cath.  Echo reveals no obvious endocarditis. Will need 6 weeks of Vanc IV per ID recs. Per primary/ID- this can be given at the OP unit.  HD tomorrow on schedule 2. 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 AST/ALT elevated. Per primary 3. R Charcot foot-Ortho consulted. Per primary. 4.  ESRD -  Usually T,Th,S.  As above 5.  Hypertension/volume  - BP on soft side. On midodrine 10 mg PO TID. UF as tolerated. Does not seem overtly volume overloaded  6.  Anemia  -Hb stable 9s.  On ESA. No acute blood loss reported.  is stable 7.  Metabolic bone disease - On renvela, no VDRA.  8.  Nutrition - Albumin low. Renal diet, prostat, renal vits.   Rexene Agent    Labs: Basic Metabolic Panel: Recent Labs  Lab 12/03/19 0446 12/04/19 0503 12/05/19 0705  NA  132* 129* 130*  K 3.7 3.8 4.1  CL 95* 95* 96*  CO2 22 21* 20*  GLUCOSE 124* 138* 150*  BUN 49* 70* 88*  CREATININE 6.03* 8.48* 10.08*  CALCIUM 8.0* 8.2* 8.1*   Liver Function Tests: Recent Labs  Lab 12/03/19 0446 12/04/19 0503 12/05/19 0705  AST 106* 92* 49*  ALT 57* 56* 41  ALKPHOS 229* 252* 216*  BILITOT 1.0 1.1 1.2  PROT 6.8 6.7 6.9  ALBUMIN 1.8* 1.8* 1.8*   No results for input(s): LIPASE, AMYLASE in the last 168 hours. Recent Labs  Lab 12/02/19 0031  AMMONIA 35   CBC: Recent Labs  Lab 12/01/19 1714 12/01/19 1733 12/02/19 0336 12/02/19 0336 12/03/19 0446 12/03/19 0446 12/04/19 0503 12/05/19 0705 12/06/19 0459  WBC 5.7   < > 5.2   < > 4.2   < > 4.4 5.0 7.1  NEUTROABS 4.5  --   --   --   --   --   --   --   --   HGB 9.8*   < > 8.5*   < > 8.7*   < > 9.4* 9.3* 9.1*  HCT 31.9*   < > 27.9*   < > 28.1*   < >  30.2* 29.1* 29.3*  MCV 82.6   < > 82.8  --  81.4  --  81.2 79.3* 82.1  PLT 86*   < > 80*   < > 68*   < > 86* 121* 136*   < > = values in this interval not displayed.   Cardiac Enzymes: No results for input(s): CKTOTAL, CKMB, CKMBINDEX, TROPONINI in the last 168 hours. CBG: Recent Labs  Lab 12/01/19 1925  GLUCAP 114*    Iron Studies: No results for input(s): IRON, TIBC, TRANSFERRIN, FERRITIN in the last 72 hours. Studies/Results: No results found. Medications: Infusions: . sodium chloride    . sodium chloride    . vancomycin 750 mg (12/05/19 1526)    Scheduled Medications: . Chlorhexidine Gluconate Cloth  6 each Topical Q0600  . Chlorhexidine Gluconate Cloth  6 each Topical Q0600  . feeding supplement (PRO-STAT SUGAR FREE 64)  30 mL Oral BID  . heparin  5,000 Units Subcutaneous Q8H  . midodrine  10 mg Oral TID AC  . multivitamin  1 tablet Oral QHS  . pantoprazole  40 mg Oral Daily  . sevelamer carbonate  800 mg Oral TID WC  . sodium chloride flush  3 mL Intravenous Q12H    have reviewed scheduled and prn medications.  Physical  Exam: General: lying flat, NAD Heart: RRR Lungs: clear b/l Abdomen: slightly distended-  ascites Extremities: min dep edema Dialysis Access: new right vascath Temp c/di   12/07/2019,2:27 PM  LOS: 5 days

## 2019-12-07 NOTE — Progress Notes (Signed)
Rehab Admissions Coordinator Note:  Per PT recommendation, this patient was screened by Raechel Ache for appropriateness for an Inpatient Acute Rehab Consult.   Given that pt was up for the first time in over a week and is already Min G for mobility, anticipate he will progress quickly while in house and completing his hospital stay. Will follow him for one more session to ensure pt continues to progress as expected. Also, it is noted pt's insurance appears to be non-participating, meaning he would most likely be out of network for [employer]. IF pt remains with needs and is interested in CIR, would recommended an in-network provider.   Raechel Ache 12/07/2019, 1:10 PM  I can be reached at [phone].

## 2019-12-07 NOTE — Progress Notes (Signed)
Acknowledging TOC consult. Attempted to speak to patient this afternoon, but patient not available.   Manya Silvas, RN MSN CCM Transitions of Care [phone]

## 2019-12-07 NOTE — Progress Notes (Addendum)
   Subjective: Mr. Konecny is feeling well. Looks forward to getting out of bed today. Discussed plan for normal dialysis schedule tomorrow. He continues to ask why he keeps getting bloodstream infections so quickly despite multiple conversations regarding the risk for infections with his current dialysis access. He has many questions regarding his charcot foot and future management with possible amputation, which we encouraged him to talk to orthopedics about. Otherwise, no acute concerns, and denies any new pain, chest pain, or shortness of breath.   Objective:  Vital signs in last 24 hours: Vitals:   12/06/19 1746 12/06/19 2048 12/06/19 2123 12/07/19 0510  BP: 101/67  106/60 105/71  Pulse: 70 69 69 77  Resp: 18 18 20 20   Temp: 98.6 F (37 C)  98.7 F (37.1 C) 98 F (36.7 C)  TempSrc: Oral  Oral Oral  SpO2: 100% 100% 98% 97%  Weight:      Height:       General: awake, alert, NAD CV: RRR Pulm: normal respiratory effort; lungs CTAB Abd: soft, non-tender, non-distended Skin: chronic venous stasis changes of bilateral LEs  Assessment/Plan:  Principal Problem:   MRSA bacteremia Active Problems:   Sepsis (HCC)   ESRD on dialysis ([place])   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 Hagerman is a 64 year old M with significant PMH of hepatorenal syndrome, ESRD on dialysis TTS, diabetes mellitus, and bell's palsy, who presented with AMS, fever,hypotension, and an elevated lactatewith work-up significantfor sepsissecondary toMRSA bacteremia.  MRSA bacteremia Blood cultures from 1/12 positive for MRSA on BCID in 4/4 bottles. Suspect right sided TDC is the infectious source. - TCD removed 1/14 with temp HD cath placement on 1/15 - TEE1/15 without evidence of endocarditisactive endocarditis  - repeat cultures from 1/13 and 1/15 with no growth to date - per ID, continue vanc with HD until 12/23/2019 - will replace [facility] when  cultures finalized as clear, likely 1/20 before discharge  ESRD Due to hepatorenal Type I. Nephrology consulted - maintain TTS schedule with dialysis tomorrow  Liver cirrhosis secondary to AUD - well compensated  Hepatorenal syndrome History of pancytopenia Not currently drinking EtOH.No signs of hepatic encephalopathy or significant ascites. - will recheck labs tomorrow - continue home midodrine 10mg  TID  Diet-controlled DM Hemoglobin A1c 6.1 on 11/26/2019 - not currently on any medications  Charcot Foot - PT/OT consulted Orthopedics evaluated pt and discussed non-operative treatments vs transtibial amputation on the right, plan for outpatient f/u with Dr. Sharol Given 1 week after discharge.  Diet -renal with 1276mL fluid restriction Fluids -none DVT ppx- heparin 5,000 units subQ q8h CODE STATUS- DNR/DNI  Dispo: Anticipated discharge in approximately 1-2 day(s). Social work consulted on pt for financial/insurance concerns.  Ladona Horns, MD 12/07/2019, 6:31 AM Pager: [phone]

## 2019-12-07 NOTE — Progress Notes (Signed)
Pt says he will place his cpap on when he is ready.  RT will continue to monitor.

## 2019-12-08 ENCOUNTER — Inpatient Hospital Stay (HOSPITAL_COMMUNITY): Payer: BLUE CROSS/BLUE SHIELD

## 2019-12-08 HISTORY — PX: IR FLUORO GUIDE CV LINE RIGHT: IMG2283

## 2019-12-08 LAB — COMPREHENSIVE METABOLIC PANEL
ALT: 27 U/L (ref 0–44)
AST: 32 U/L (ref 15–41)
Albumin: 1.8 g/dL — ABNORMAL LOW (ref 3.5–5.0)
Alkaline Phosphatase: 208 U/L — ABNORMAL HIGH (ref 38–126)
Anion gap: 15 (ref 5–15)
BUN: 76 mg/dL — ABNORMAL HIGH (ref 8–23)
CO2: 22 mmol/L (ref 22–32)
Calcium: 7.7 mg/dL — ABNORMAL LOW (ref 8.9–10.3)
Chloride: 94 mmol/L — ABNORMAL LOW (ref 98–111)
Creatinine, Ser: 10.46 mg/dL — ABNORMAL HIGH (ref 0.61–1.24)
GFR calc Af Amer: 5 mL/min — ABNORMAL LOW (ref >60)
GFR calc non Af Amer: 5 mL/min — ABNORMAL LOW (ref >60)
Glucose, Bld: 125 mg/dL — ABNORMAL HIGH (ref 70–99)
Potassium: 4.2 mmol/L (ref 3.5–5.1)
Sodium: 131 mmol/L — ABNORMAL LOW (ref 135–145)
Total Bilirubin: 0.8 mg/dL (ref 0.3–1.2)
Total Protein: 7.1 g/dL (ref 6.5–8.1)

## 2019-12-08 LAB — CBC
HCT: 29.3 % — ABNORMAL LOW (ref 39.0–52.0)
Hemoglobin: 9.1 g/dL — ABNORMAL LOW (ref 13.0–17.0)
MCH: 25.4 pg — ABNORMAL LOW (ref 26.0–34.0)
MCHC: 31.1 g/dL (ref 30.0–36.0)
MCV: 81.8 fL (ref 80.0–100.0)
Platelets: 148 10*3/uL — ABNORMAL LOW (ref 150–400)
RBC: 3.58 MIL/uL — ABNORMAL LOW (ref 4.22–5.81)
RDW: 16.9 % — ABNORMAL HIGH (ref 11.5–15.5)
WBC: 6.9 10*3/uL (ref 4.0–10.5)
nRBC: 0 % (ref 0.0–0.2)

## 2019-12-08 MED ORDER — MIDAZOLAM HCL 2 MG/2ML IJ SOLN
INTRAMUSCULAR | Status: AC
  Filled 2019-12-08: qty 2

## 2019-12-08 MED ORDER — VANCOMYCIN HCL IN DEXTROSE 750-5 MG/150ML-% IV SOLN: 750.0000 mg | INTRAVENOUS | 0 refills | Status: DC

## 2019-12-08 MED ORDER — CEFAZOLIN SODIUM-DEXTROSE 2-4 GM/100ML-% IV SOLN
INTRAVENOUS | Status: AC
  Administered 2019-12-08: 2000 mg
  Filled 2019-12-08: qty 100

## 2019-12-08 MED ORDER — CHLORHEXIDINE GLUCONATE 4 % EX LIQD
CUTANEOUS | Status: AC
  Filled 2019-12-08: qty 15

## 2019-12-08 MED ORDER — LIDOCAINE HCL 1 % IJ SOLN
INTRAMUSCULAR | Status: AC
  Filled 2019-12-08: qty 20

## 2019-12-08 MED ORDER — MIDODRINE HCL 5 MG PO TABS
ORAL_TABLET | ORAL | Status: AC
  Filled 2019-12-08: qty 2

## 2019-12-08 MED ORDER — MIDAZOLAM HCL 2 MG/2ML IJ SOLN
INTRAMUSCULAR | Status: AC | PRN
  Administered 2019-12-08: 1 mg via INTRAVENOUS

## 2019-12-08 MED ORDER — FENTANYL CITRATE (PF) 100 MCG/2ML IJ SOLN
INTRAMUSCULAR | Status: AC | PRN
  Administered 2019-12-08: 50 ug via INTRAVENOUS

## 2019-12-08 MED ORDER — HEPARIN SODIUM (PORCINE) 1000 UNIT/ML IJ SOLN
INTRAMUSCULAR | Status: AC
  Filled 2019-12-08: qty 4

## 2019-12-08 MED ORDER — LIDOCAINE HCL 1 % IJ SOLN
INTRAMUSCULAR | Status: AC | PRN
  Administered 2019-12-08: 10 mL

## 2019-12-08 MED ORDER — VANCOMYCIN HCL IN DEXTROSE 750-5 MG/150ML-% IV SOLN
INTRAVENOUS | Status: AC
  Administered 2019-12-08: 750 mg via INTRAVENOUS
  Filled 2019-12-08: qty 150

## 2019-12-08 MED ORDER — FENTANYL CITRATE (PF) 100 MCG/2ML IJ SOLN
INTRAMUSCULAR | Status: AC
  Filled 2019-12-08: qty 2

## 2019-12-08 MED ORDER — HEPARIN SODIUM (PORCINE) 1000 UNIT/ML IJ SOLN
INTRAMUSCULAR | Status: AC
  Administered 2019-12-08: 3.8 mL
  Filled 2019-12-08: qty 1

## 2019-12-08 NOTE — Progress Notes (Signed)
HD initiated via R IJ catheter. Lines reversed due to high arterial pressures. BFR decreased to 300. Patient given am dose of midodrine for bp support. No current complaints, denies pain. Continue to monitor.

## 2019-12-08 NOTE — Discharge Summary (Signed)
Name: Carlos Hamilton MRN: [id number] DOB: 1956-06-26 64 y.o. PCP: Nickola Major, MD  Date of Admission: 12/01/2019  5:13 PM Date of Discharge: 12/09/2019 Attending Physician: Axel Filler, *  Discharge Diagnosis: Principal Problem:   MRSA bacteremia Active Problems:   Sepsis ([place])   ESRD on dialysis [facility])   Diabetes mellitus with Charcot's joint arthropathy ([place])   Charcot's joint, right ankle and foot  Discharge Medications: Allergies as of 12/09/2019   No Known Allergies     Medication List    TAKE these medications   acetaminophen 325 MG tablet Commonly known as: TYLENOL Take 650 mg by mouth every 8 (eight) hours as needed for mild pain or headache.   [employer] Moisturizing Extra Dry Crea Take 1 application by mouth See admin instructions. Apply to dry areas one to two times daily   midodrine 10 MG tablet Commonly known as: PROAMATINE Take 10 mg by mouth 3 (three) times daily before meals.   sodium hypochlorite external solution Commonly known as: DAKIN'S 1/2 STRENGTH Irrigate with 1 application as directed daily as needed (when wound dressings are applied).   Vancomycin 750-5 MG/150ML-% Soln Commonly known as: VANCOCIN Inject 150 mLs (750 mg total) into the vein Every Tuesday,Thursday,and Saturday with dialysis for 13 days. Start taking on: December 10, 2019   XEROFORM PETROLATUM DRESSING EX Apply 1 patch topically daily as needed (for wound/dressing changes).       Disposition and follow-up:   Mr.Carlos Hamilton was discharged from [facility] in Stable condition.  At the hospital follow up visit please address:  1.  MRSA bacteremia - bacteremia most likely secondary to tunneled dialysis catheter - no evidence of active endocarditis on TEE - continue vancomycin with outpatient dialysis till 12/23/2019 - new TDC placed on 12/08/2019  ESRD - continue discussions with pt regarding fistula placement to avoid continued bacteremia  R Charcot  foot - MRI with interval progression of Charcot arthropathy without acute osteomyelitis - pt evaluated by orthopedics - recommended outpatient follow-up for continued discussion regarding non-surgical vs amputation treatment options  2.  Labs / imaging needed at time of follow-up: NONE  3.  Pending labs/ test needing follow-up: NONE  Follow-up Appointments: Follow-up Information    Newt Minion, MD Follow up in 1 week(s).   Specialty: Orthopedic Surgery Contact information: [address] [place] [address] [place] by problem list: 1. [name] Hamilton is a 64 year old M with significant PMH of hepatorenal syndrome, ESRD on dialysis TTS, diabetes mellitus, and bell's palsy, who presented with AMS, fever,hypotension, and an elevated lactate concerning for sepsis. Initial assessment worrisome for acute osteomyeltitis in R Charcot foot, though subsequent MRI was clear. Blood cultures taken on admission then returned with 4/4 bottles positive for MRSA. Source of infection was suspected to be pt's tunneled HD catheter, though it had no appearance of infection on exam. Pt was treated with vancomycin, given during inpatient HD. His initial TDC was removed on 1/14, and a temporary one was placed on the afternoon of 1/15. 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 Repeat blood culture on 1/13 and 1/15 both were cleared of bacteremia. A new TDC was placed on 1/19. Infectious disease recommended the pt continue on vancomycin therapy at outpatient dialysis till 12/23/2019. Pt was discharged home to  resume outpatient HD on TTS on 12/09/2019.  It was discussed on multiple occasions by multiple providers that pt should consider more permanent access and fistula to decrease infection risk. Pt focused on the idea of waiting for a fistula to  mature and expressed concerns over seeing pt's with malfunctioning fistulas at his outpatient HD center. Pt was instructed to follow-up with his outpatient nephrologist and PCP regarding continued risk/benefit discussions.   Discharge Vitals:   BP (!) 92/59 (BP Location: Left Arm)   Pulse 70   Temp 98 F (36.7 C) (Oral)   Resp 18   Ht 6' (1.829 m)   Wt 94.2 kg   SpO2 99%   BMI 28.17 kg/m   Pertinent Labs, Studies, and Procedures:  CBC Latest Ref Rng & Units 12/09/2019 12/08/2019 12/06/2019  WBC 4.0 - 10.5 K/uL 6.3 6.9 7.1  Hemoglobin 13.0 - 17.0 g/dL 8.6(L) 9.1(L) 9.1(L)  Hematocrit 39.0 - 52.0 % 28.4(L) 29.3(L) 29.3(L)  Platelets 150 - 400 K/uL 129(L) 148(L) 136(L)   BMP Latest Ref Rng & Units 12/09/2019 12/08/2019 12/05/2019  Glucose 70 - 99 mg/dL 140(H) 125(H) 150(H)  BUN 8 - 23 mg/dL 46(H) 76(H) 88(H)  Creatinine 0.61 - 1.24 mg/dL 7.80(H) 10.46(H) 10.08(H)  Sodium 135 - 145 mmol/L 128(L) 131(L) 130(L)  Potassium 3.5 - 5.1 mmol/L 3.6 4.2 4.1  Chloride 98 - 111 mmol/L 93(L) 94(L) 96(L)  CO2 22 - 32 mmol/L 22 22 20(L)  Calcium 8.9 - 10.3 mg/dL 7.5(L) 7.7(L) 8.1(L)   Recent Results (from the past 720 hour(s))  Blood Culture (routine x 2)     Status: Abnormal   Collection Time: 12/01/19  5:20 PM   Specimen: BLOOD RIGHT ARM  Result Value Ref Range Status   Specimen Description BLOOD RIGHT ARM  Final   Special Requests   Final    BOTTLES DRAWN AEROBIC AND ANAEROBIC Blood Culture results may not be optimal due to an inadequate volume of blood received in culture bottles   Culture  Setup Time   Final    GRAM POSITIVE COCCI IN BOTH AEROBIC AND ANAEROBIC BOTTLES CRITICAL RESULT CALLED TO, READ BACK BY AND VERIFIED WITH: PHARMD J FRENS [id number] AT 4 AM BY CM    Culture (A)  Final    STAPHYLOCOCCUS AUREUS SUSCEPTIBILITIES PERFORMED ON PREVIOUS CULTURE WITHIN THE LAST 5 DAYS. Performed at [facility], [place] [address], [place], [place] [address]    Report Status 12/04/2019 FINAL   Final  Culture, blood (single)     Status: Abnormal   Collection Time: 12/01/19  7:14 PM   Specimen: BLOOD  Result Value Ref Range Status   Specimen Description BLOOD SITE NOT SPECIFIED  Final   Special Requests   Final    AEROBIC BOTTLE ONLY Blood Culture results may not be optimal due to an inadequate volume of blood received in culture bottles   Culture  Setup Time   Final    GRAM POSITIVE COCCI AEROBIC BOTTLE ONLY CRITICAL VALUE NOTED.  VALUE IS CONSISTENT WITH PREVIOUSLY REPORTED AND CALLED VALUE.    Culture (A)  Final    STAPHYLOCOCCUS AUREUS SUSCEPTIBILITIES PERFORMED ON PREVIOUS CULTURE WITHIN THE LAST 5 DAYS. Performed at [facility], [place] [address] [place], [place] [address]    Report Status 12/04/2019 FINAL  Final  Blood Culture (routine x 2)     Status: Abnormal   Collection Time: 12/01/19  7:34 PM   Specimen: BLOOD RIGHT FOREARM  Result Value Ref Range  Status   Specimen Description BLOOD RIGHT FOREARM  Final   Special Requests   Final    BOTTLES DRAWN AEROBIC AND ANAEROBIC Blood Culture adequate volume   Culture  Setup Time   Final    GRAM POSITIVE COCCI IN BOTH AEROBIC AND ANAEROBIC BOTTLES CRITICAL RESULT CALLED TO, READ BACK BY AND VERIFIED WITH: PHARMD J FRENS [id number] AT [id number] AM BY CM Performed at [facility], [place] [address], [place], [place] [address]    Culture METHICILLIN RESISTANT STAPHYLOCOCCUS AUREUS (A)  Final   Report Status 12/04/2019 FINAL  Final   Organism ID, Bacteria METHICILLIN RESISTANT STAPHYLOCOCCUS AUREUS  Final      Susceptibility   Methicillin resistant staphylococcus aureus - MIC*    CIPROFLOXACIN >=8 RESISTANT Resistant     ERYTHROMYCIN >=8 RESISTANT Resistant     GENTAMICIN <=0.5 SENSITIVE Sensitive     OXACILLIN >=4 RESISTANT Resistant     TETRACYCLINE <=1 SENSITIVE Sensitive     VANCOMYCIN 1 SENSITIVE Sensitive     TRIMETH/SULFA <=10 SENSITIVE Sensitive     CLINDAMYCIN <=0.25 SENSITIVE Sensitive     RIFAMPIN <=0.5  SENSITIVE Sensitive     Inducible Clindamycin NEGATIVE Sensitive     * METHICILLIN RESISTANT STAPHYLOCOCCUS AUREUS  Blood Culture ID Panel (Reflexed)     Status: Abnormal   Collection Time: 12/01/19  7:34 PM  Result Value Ref Range Status   Enterococcus species NOT DETECTED NOT DETECTED Final   Listeria monocytogenes NOT DETECTED NOT DETECTED Final   Staphylococcus species DETECTED (A) NOT DETECTED Final    Comment: CRITICAL RESULT CALLED TO, READ BACK BY AND VERIFIED WITH: PHARMD J FRENS [id number] AT 812 BY CM    Staphylococcus aureus (BCID) DETECTED (A) NOT DETECTED Final    Comment: Methicillin (oxacillin)-resistant Staphylococcus aureus (MRSA). MRSA is predictably resistant to beta-lactam antibiotics (except ceftaroline). Preferred therapy is vancomycin unless clinically contraindicated. Patient requires contact precautions if  hospitalized. CRITICAL RESULT CALLED TO, READ BACK BY AND VERIFIED WITH: PHARMD [id number] AT 812 AM BY CM    Methicillin resistance DETECTED (A) NOT DETECTED Final    Comment: CRITICAL RESULT CALLED TO, READ BACK BY AND VERIFIED WITH: PHARMD J FRENS [id number] AT 812 BY CN=M    Streptococcus species NOT DETECTED NOT DETECTED Final   Streptococcus agalactiae NOT DETECTED NOT DETECTED Final   Streptococcus pneumoniae NOT DETECTED NOT DETECTED Final   Streptococcus pyogenes NOT DETECTED NOT DETECTED Final   Acinetobacter baumannii NOT DETECTED NOT DETECTED Final   Enterobacteriaceae species NOT DETECTED NOT DETECTED Final   Enterobacter cloacae complex NOT DETECTED NOT DETECTED Final   Escherichia coli NOT DETECTED NOT DETECTED Final   Klebsiella oxytoca NOT DETECTED NOT DETECTED Final   Klebsiella pneumoniae NOT DETECTED NOT DETECTED Final   Proteus species NOT DETECTED NOT DETECTED Final   Serratia marcescens NOT DETECTED NOT DETECTED Final   Haemophilus influenzae NOT DETECTED NOT DETECTED Final   Neisseria meningitidis NOT DETECTED NOT DETECTED Final    Pseudomonas aeruginosa NOT DETECTED NOT DETECTED Final   Candida albicans NOT DETECTED NOT DETECTED Final   Candida glabrata NOT DETECTED NOT DETECTED Final   Candida krusei NOT DETECTED NOT DETECTED Final   Candida parapsilosis NOT DETECTED NOT DETECTED Final   Candida tropicalis NOT DETECTED NOT DETECTED Final    Comment: Performed at [facility], [place] [address] [place], [place] [address]  Respiratory Panel by RT PCR (Flu A&B, Covid) - Nasopharyngeal Swab     Status: None  Collection Time: 12/01/19  9:05 PM   Specimen: Nasopharyngeal Swab  Result Value Ref Range Status   SARS Coronavirus 2 by RT PCR NEGATIVE NEGATIVE Final    Comment: (NOTE) SARS-CoV-2 target nucleic acids are NOT DETECTED. The SARS-CoV-2 RNA is generally detectable in upper respiratoy specimens during the acute phase of infection. The lowest concentration of SARS-CoV-2 viral copies this assay can detect is 131 copies/mL. A negative result does not preclude SARS-Cov-2 infection and should not be used as the sole basis for treatment or other patient management decisions. A negative result may occur with  improper specimen collection/handling, submission of specimen other than nasopharyngeal swab, presence of viral mutation(s) within the areas targeted by this assay, and inadequate number of viral copies (<131 copies/mL). A negative result must be combined with clinical observations, patient history, and epidemiological information. The expected result is Negative. Fact Sheet for Patients:  PinkCheek.be Fact Sheet for Healthcare Providers:  GravelBags.it This test is not yet ap proved or cleared by the [place] FDA and  has been authorized for detection and/or diagnosis of SARS-CoV-2 by FDA under an Emergency Use Authorization (EUA). This EUA will remain  in effect (meaning this test can be used) for the duration of the COVID-19 declaration under  Section 564(b)(1) of the Act, 21 U.S.C. section 360bbb-3(b)(1), unless the authorization is terminated or revoked sooner.    Influenza A by PCR NEGATIVE NEGATIVE Final   Influenza B by PCR NEGATIVE NEGATIVE Final    Comment: (NOTE) The Xpert Xpress SARS-CoV-2/FLU/RSV assay is intended as an aid in  the diagnosis of influenza from Nasopharyngeal swab specimens and  should not be used as a sole basis for treatment. Nasal washings and  aspirates are unacceptable for Xpert Xpress SARS-CoV-2/FLU/RSV  testing. Fact Sheet for Patients: PinkCheek.be Fact Sheet for Healthcare Providers: GravelBags.it This test is not yet approved or cleared by the [place] FDA and  has been authorized for detection and/or diagnosis of SARS-CoV-2 by  FDA under an Emergency Use Authorization (EUA). This EUA will remain  in effect (meaning this test can be used) for the duration of the  Covid-19 declaration under Section 564(b)(1) of the Act, 21  U.S.C. section 360bbb-3(b)(1), unless the authorization is  terminated or revoked. Performed at [facility], [place] [address] [place], [place] [address]   Culture, blood (routine x 2)     Status: None   Collection Time: 12/02/19  9:10 PM   Specimen: BLOOD RIGHT HAND  Result Value Ref Range Status   Specimen Description BLOOD RIGHT HAND  Final   Special Requests   Final    BOTTLES DRAWN AEROBIC AND ANAEROBIC Blood Culture adequate volume   Culture   Final    NO GROWTH 5 DAYS Performed at [facility], [place] [address] [place], [place] [address]    Report Status 12/07/2019 FINAL  Final  Culture, blood (routine x 2)     Status: None   Collection Time: 12/02/19  9:20 PM   Specimen: BLOOD LEFT HAND  Result Value Ref Range Status   Specimen Description BLOOD LEFT HAND  Final   Special Requests   Final    BOTTLES DRAWN AEROBIC AND ANAEROBIC Blood Culture adequate volume   Culture   Final     NO GROWTH 5 DAYS Performed at [facility], [place] [address] [place], [place] [address]    Report Status 12/07/2019 FINAL  Final  MRSA PCR Screening  Status: None   Collection Time: 12/03/19  8:12 PM   Specimen: Nasal Mucosa; Nasopharyngeal  Result Value Ref Range Status   MRSA by PCR NEGATIVE NEGATIVE Final    Comment:        The GeneXpert MRSA Assay (FDA approved for NASAL specimens only), is one component of a comprehensive MRSA colonization surveillance program. It is not intended to diagnose MRSA infection nor to guide or monitor treatment for MRSA infections. Performed at [facility], [place] [address], [place], [place] [address]   Culture, blood (routine x 2)     Status: None   Collection Time: 12/04/19  7:22 AM   Specimen: BLOOD  Result Value Ref Range Status   Specimen Description BLOOD RIGHT ANTECUBITAL  Final   Special Requests   Final    BOTTLES DRAWN AEROBIC AND ANAEROBIC Blood Culture adequate volume   Culture   Final    NO GROWTH 5 DAYS Performed at [facility], [place] [address] [place], [place] [address]    Report Status 12/09/2019 FINAL  Final  Culture, blood (routine x 2)     Status: None   Collection Time: 12/04/19  7:32 AM   Specimen: BLOOD RIGHT HAND  Result Value Ref Range Status   Specimen Description BLOOD RIGHT HAND  Final   Special Requests   Final    BOTTLES DRAWN AEROBIC ONLY Blood Culture adequate volume   Culture   Final    NO GROWTH 5 DAYS Performed at [facility], [place] [address] [place], [place] [address]    Report Status 12/09/2019 FINAL  Final   TEE on 12/04/2019 IMPRESSION:  1. No active endocarditis 2. 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 No LAA thrombus 4. Negative for PFO by color doppler 5. Normal biatrial size 6. LVEF 60-65%  TTE 12/02/2019 IMPRESSIONS:  1. Left ventricular ejection fraction, by visual  estimation, is 60 to 65%. The left ventricle has normal function. There is no left ventricular hypertrophy.  2. Left ventricular diastolic parameters are consistent with Grade I diastolic dysfunction (impaired relaxation).  3. The left ventricle has no regional wall motion abnormalities.  4. Global right ventricle has normal systolic function.The right ventricular size is normal. No increase in right ventricular wall thickness.  5. Left atrial size was normal.  6. Right atrial size was normal.  7. Mild mitral annular calcification.  8. The mitral valve is normal in structure. No evidence of mitral valve regurgitation. No evidence of mitral stenosis.  9. The tricuspid valve is normal in structure. 10. The aortic valve is tricuspid. Aortic valve regurgitation is not visualized. Mild aortic valve sclerosis without stenosis. 11. TR signal is inadequate for assessing pulmonary artery systolic pressure. 12. The inferior vena cava is normal in size with greater than 50% respiratory variability, suggesting right atrial pressure of 3 mmHg.   RIGHT FOOT MRI 12/02/2019 CLINICAL DATA:  Right foot swelling, fever and tachycardia end-stage renal disease  EXAM: MRI OF THE RIGHT FOREFOOT WITHOUT CONTRAST  TECHNIQUE: Multiplanar, multisequence MR imaging of the right was performed. No intravenous contrast was administered.  COMPARISON:  June 19, 2019  FINDINGS: Bones/Joint/Cartilage  There is interval progression in the midfoot Charcot arthropathy. There is fragmentation and destruction of the navicular as well as the base of the metatarsals diffusely. There is also interval progression of cortical destruction of the anterior calcaneus with diffusely  increased marrow signal and cystic changes. There is inferior subluxation of the talus with diffusely increased T2 hyperintense signal. There is diffuse T2 hyperintense signal seen through the distal tibia and fibula as well. Increased marrow  signal seen through the metatarsals. 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 Heterogeneous non loculated fluid seen within the midfoot.  Ligaments  There is chronic complete disruption of the Lisfranc ligaments. Suboptimal visualization of the remainder of the ligaments. The plantar fascia is thickened and intact.  Muscles and Tendons  Diffuse fatty atrophy of the muscles surrounding the foot. There is heterogeneous fluid surrounding the peroneal tendons. The Achilles tendon appears to be intact. The distal portions of the flexor extensor tendons appear to be intact.  Soft tissues  Diffuse dorsal subcutaneous edema seen surrounding the midfoot. No definite focal ulceration or loculated fluid collection. No sinus tract is seen.  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 Diffuse marrow signal changes seen throughout the midfoot and hindfoot which are likely due to acute Charcot arthropathy, not thought to be due to acute osteomyelitis without overlying skin ulceration or sinus tract.   CT HEAD 12/01/2019 CLINICAL DATA:  64 year old male with altered mental status since leaving dialysis at 1500 hours today. Fever at presentation.  EXAM: CT HEAD WITHOUT CONTRAST  TECHNIQUE: Contiguous axial images were obtained from the base of the skull through the vertex without intravenous contrast.  COMPARISON:  None.  FINDINGS: Brain: Cerebral volume is within normal limits for age. No midline shift, ventriculomegaly, mass effect, evidence of mass lesion, intracranial hemorrhage or evidence of cortically based acute infarction. Largely normal for age gray-white matter differentiation throughout the brain, although there is a small age indeterminate hypodensity in the right basal ganglia on series 3, image 14. No cortical encephalomalacia identified.  Vascular: Calcified atherosclerosis at the  skull base. No suspicious intracranial vascular hyperdensity.  Skull: Negative.  Sinuses/Orbits: Visualized paranasal sinuses and mastoids are clear.  Other: No acute orbit or scalp soft tissue findings. Scalp vessel calcified atherosclerosis.  IMPRESSION: 1. Small age indeterminate lacunar infarct in the right basal ganglia. If acutely symptomatic this would result in left side symptoms. 2. Otherwise normal for age noncontrast CT appearance of the brain.   CXR 12/01/2019 CLINICAL DATA:  Altered mental status  EXAM: PORTABLE CHEST 1 VIEW  COMPARISON:  08/22/2019  FINDINGS: Right dialysis catheter in place with the tip at the cavoatrial junction. Heart is normal size. No confluent opacities or effusions. No acute bony abnormality.  IMPRESSION: No active disease.   Discharge Instructions: Discharge Instructions    Call MD for:  difficulty breathing, headache or visual disturbances   Complete by: As directed    Call MD for:  extreme fatigue   Complete by: As directed    Call MD for:  hives   Complete by: As directed    Call MD for:  persistant dizziness or light-headedness   Complete by: As directed    Call MD for:  persistant nausea and vomiting   Complete by: As directed    Call MD for:  redness, tenderness, or signs of infection (pain, swelling, redness, odor or green/yellow discharge around incision site)   Complete by: As directed    Call MD for:  severe uncontrolled pain   Complete by: As directed    Call MD for:  temperature >100.4   Complete by: As directed    Diet - low sodium heart healthy   Complete  by: As directed    Discharge instructions   Complete by: As directed    Mr. Nickalis, Warrell were seen in the hospital for a MRSA bloodstream infection. This was likely caused by your dialysis catheter. You were treated with antibiotics at dialysis, which should continue at your outpatient center until 12/23/2019. Lab work and imaging showed clearance of  the bacteria and no infection on your heart valves, so a new catheter was placed on 12/08/2019. It will be important to continue to speak with your primary doctor and kidney doctor regarding the risk/benefits of access options, and consider more permanent access with a fistula as it has a much lower risk of repeat infections.  Additionally, please follow-up with Dr. Sharol Given in the following 1-2 weeks regarding treatment options for your Charcot foot.  Thank you for letting us be a part of your care!   Increase activity slowly   Complete by: As directed       Signed: Ladona Horns, MD 12/09/2019, 9:39 AM   Pager: [phone]

## 2019-12-08 NOTE — Progress Notes (Signed)
Patient refuses CPAP therapy at this time. 

## 2019-12-08 NOTE — Progress Notes (Signed)
Physical Therapy Treatment Patient Details Name: Carlos Hamilton MRN: [id number] DOB: 1956/11/01 Today's Date: 12/08/2019    History of Present Illness 64 yo male with onset of Sepsis from MRSA bacteremia from HD port.  Pt has been seen prev for infection in R ankle, with charcot foot.  Pt was cleared for endocarditis, has cirrhosis with pt discussing transplant of liver.  PMHx:  DM, Charcot foot RLE, ESRD, cirrhosis liver with EtOH,  hepatorenal syndrome    PT Comments    Pt was seen for gait on the hallway with cam boot and L shoe, noting control of walker and fatigue being an issue.  Was able to do stair-climbing with B rails and min assist for balance and support.  Talked with MD, Case manager and nursing staff to determine pt is still quite weak and will need family support, caregiver help and HHPT for success.  CIR has not yet responded to request for admission but is unlikely to take pt as beds are backlogged.  Follow acutely to continue to practice mobility, to increase safety with specialized footwear and to work on control of balance with challenging situations.  Follow Up Recommendations  CIR     Equipment Recommendations  None recommended by PT    Recommendations for Other Services Rehab consult     Precautions / Restrictions Precautions Precautions: Fall Precaution Comments: monitor BP Restrictions Weight Bearing Restrictions: No    Mobility  Bed Mobility Overal bed mobility: Needs Assistance Bed Mobility: Supine to Sit     Supine to sit: Min assist     General bed mobility comments: min assist for support of trunk after procedure today  Transfers Overall transfer level: Needs assistance Equipment used: Rolling walker (2 wheeled);1 person hand held assist Transfers: Sit to/from Stand Sit to Stand: Mod assist;From elevated surface         General transfer comment: mod to power up and min to control balance initially  Ambulation/Gait Ambulation/Gait  assistance: Min assist Gait Distance (Feet): 70 Feet Assistive device: Rolling walker (2 wheeled);1 person hand held assist Gait Pattern/deviations: Step-to pattern;Step-through pattern;Decreased stride length;Wide base of support Gait velocity: reduced Gait velocity interpretation: <1.31 ft/sec, indicative of household ambulator General Gait Details: weak and mildly buckled appearance of knees in cam boot on R foot and L shoe   Stairs Stairs: Yes Stairs assistance: Min assist Stair Management: One rail Right;One rail Left;Step to pattern;Forwards;Backwards Number of Stairs: 3 General stair comments: stepped up three times   Wheelchair Mobility    Modified Rankin (Stroke Patients Only)       Balance Overall balance assessment: Needs assistance Sitting-balance support: Bilateral upper extremity supported;Feet supported Sitting balance-Leahy Scale: Fair     Standing balance support: Bilateral upper extremity supported;During functional activity Standing balance-Leahy Scale: Poor Standing balance comment: correction of standing posture and control of walker                            Cognition Arousal/Alertness: Awake/alert Behavior During Therapy: Flat affect Overall Cognitive Status: No family/caregiver present to determine baseline cognitive functioning                                 General Comments: repetitive questions are concerning      Exercises      General Comments General comments (skin integrity, edema, etc.): pt repetitively asked if he was allowed or cleared  to go home      Pertinent Vitals/Pain Pain Assessment: No/denies pain    Home Living                      Prior Function            PT Goals (current goals can now be found in the care plan section) Acute Rehab PT Goals Patient Stated Goal: to go directly home if possibel Progress towards PT goals: Progressing toward goals    Frequency    Min  3X/week      PT Plan Current plan remains appropriate    Co-evaluation              AM-PAC PT "6 Clicks" Mobility   Outcome Measure  Help needed turning from your back to your side while in a flat bed without using bedrails?: A Little Help needed moving from lying on your back to sitting on the side of a flat bed without using bedrails?: A Little Help needed moving to and from a bed to a chair (including a wheelchair)?: A Lot Help needed standing up from a chair using your arms (e.g., wheelchair or bedside chair)?: A Lot Help needed to walk in hospital room?: A Little Help needed climbing 3-5 steps with a railing? : A Little 6 Click Score: 16    End of Session Equipment Utilized During Treatment: Gait belt Activity Tolerance: Patient limited by fatigue;Treatment limited secondary to medical complications (Comment) Patient left: in chair;with call bell/phone within reach;with chair alarm set Nurse Communication: Mobility status PT Visit Diagnosis: Unsteadiness on feet (R26.81);Muscle weakness (generalized) (M62.81);Difficulty in walking, not elsewhere classified (R26.2);Dizziness and giddiness (R42)     Time: [id number] PT Time Calculation (min) (ACUTE ONLY): 50 min  Charges:  $Gait Training: 8-22 mins $Therapeutic Activity: 23-37 mins                Carlos Hamilton 12/08/2019, 8:40 PM   Carlos Hamilton, PT MS Acute Rehab Dept. Number: [place] and [place]

## 2019-12-08 NOTE — Progress Notes (Signed)
Pharmacy Antibiotic Note  Carlos Hamilton is a 64 y.o. male admitted on 12/01/2019 with sepsis.  Pharmacy has been consulted for Vancomycin dosing.  Calculated vanocomycin level after HD should be within goal of 15-25. Patient's TDC not running great and is having difficulties with higher BFRs. Will monitor closely and adjust vancomycin. When patient goes to outpatient dialysis and receives full 4.5 hour treatment with BFR of 350-400 mL/min, the dose will need to be increased to 1000 mg qTThS with HD sessions.  WBC 6.9 and repeat Bcx negative. Vital signs stable  Plan: Continue Vancomycin 750 mg qTThS with HD sessions while inpatient. F/u renal plans, C&S, clinical status and pre-HD vanc levels PRN Monitor cultures, temp, and WBC.   Height: 6' (182.9 cm) Weight: 214 lb 4.6 oz (97.2 kg) IBW/kg (Calculated) : 77.6  Temp (24hrs), Avg:98.6 F (37 C), Min:98.4 F (36.9 C), Max:98.8 F (37.1 C)  Recent Labs  Lab 12/01/19 1714 12/01/19 1721 12/01/19 1733 12/01/19 2349 12/02/19 0336 12/02/19 0336 12/03/19 0446 12/04/19 0503 12/05/19 0705 12/06/19 0459 12/08/19 0556  WBC   < >  --   --   --  5.2   < > 4.2 4.4 5.0 7.1 6.9  CREATININE   < >  --    < >  --  7.17*  --  6.03* 8.48* 10.08*  --  10.46*  LATICACIDVEN  --  2.7*  --  1.4  --   --   --   --   --   --   --   VANCORANDOM  --   --   --   --   --   --   --   --  22  --   --    < > = values in this interval not displayed.    Estimated Creatinine Clearance: 8.7 mL/min (A) (by C-G formula based on SCr of 10.46 mg/dL (H)).    No Known Allergies  Antimicrobials this admission: 1/12 Cefepime x1 1/12 Vancomycin >> (12/23/2019 per ID)  Microbiology results: 1/12 BCx: MRSA  Sherren Kerns, PharmD PGY1 Acute Care Pharmacy Resident Please utilize Amion for appropriate phone number to reach the unit pharmacist ([place]) 12/08/2019 11:04 AM

## 2019-12-08 NOTE — Progress Notes (Signed)
   Subjective: Pt seen at dialysis this morning. Doing well, and reports no acute concerns. Is aware of plan to replace his catheter today after dialysis and then discharge home. Pt has many of the same questions regarding his medical conditions. All questions and concerns addressed.   Objective:  Vital signs in last 24 hours: Vitals:   12/07/19 0510 12/07/19 1014 12/07/19 2019 12/08/19 0551  BP: 105/71 98/63 (!) 92/57 (!) 89/53  Pulse: 77 81 72 70  Resp: 20 20 16 16   Temp: 98 F (36.7 C) 97.9 F (36.6 C) 98.8 F (37.1 C) 98.7 F (37.1 C)  TempSrc: Oral Oral Oral Oral  SpO2: 97% 96% 98% 97%  Weight:   98.2 kg   Height:       Physical Exam Vitals and nursing note reviewed.  Constitutional:      General: He is not in acute distress.    Appearance: He is not ill-appearing.  Cardiovascular:     Rate and Rhythm: Normal rate and regular rhythm.     Heart sounds: Normal heart sounds.  Musculoskeletal:     Right lower leg: No edema.     Left lower leg: No edema.  Skin:    General: Skin is warm and dry.     Comments: Chronic venous stasis changes bilaterally.  Neurological:     Mental Status: He is alert.    Assessment/Plan:  Principal Problem:   MRSA bacteremia Active Problems:   Sepsis ([place])   ESRD on dialysis ([place])   Diabetes mellitus with Charcot's joint arthropathy ([place])   Charcot's joint, right ankle and foot  Mr. Banfield is a 64 year old M with significant PMH of hepatorenal syndrome, ESRD on dialysis TTS, diabetes mellitus, and bell's palsy, who presented with AMS, fever,hypotension, and an elevated lactatewith work-up significantfor sepsissecondary toMRSA bacteremia.  MRSA bacteremia Blood cultures from 1/12 positive for MRSA on BCID in 4/4 bottles. Suspect right sided TDC is the infectious source. 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 - lab work stable - continue home midodrine 10mg  TID  Diet-controlled DM Hemoglobin A1c 6.1 on 11/26/2019 - not currently on any medications  Charcot Foot - PT recommended CIR, though CIR stated pt progressing quickly in house Orthopedicsevaluated pt and discussed non-operative treatments vs transtibial amputation on the right, plan for outpatient f/u with Dr. Sharol Given 1 week after discharge.  Diet -renal with 1211mL fluid restriction Fluids -none DVT ppx- heparin 5,000 units subQ q8h CODE STATUS- DNR/DNI  Dispo: Anticipated discharge today   Ladona Horns, MD 12/08/2019, 6:45 AM Pager: [phone]

## 2019-12-08 NOTE — Plan of Care (Signed)
  Problem: Coping: Goal: Level of anxiety will decrease Outcome: Completed/Met   Problem: Elimination: Goal: Will not experience complications related to bowel motility Outcome: Completed/Met Goal: Will not experience complications related to urinary retention Outcome: Completed/Met   Problem: Pain Managment: Goal: General experience of comfort will improve Outcome: Completed/Met   Problem: Safety: Goal: Ability to remain free from injury will improve Outcome: Completed/Met   Problem: Clinical Measurements: Goal: Complications related to the disease process, condition or treatment will be avoided or minimized Outcome: Completed/Met

## 2019-12-08 NOTE — TOC Initial Note (Signed)
Transition of Care [facility]) - Initial/Assessment Note    Patient Details  Name: Carlos Hamilton MRN: [id number] Date of Birth: May 18, 1956  Transition of [facility]) CM/SW Contact:    Bartholomew Crews, RN Phone Number: [phone] 12/08/2019, 5:36 PM  Clinical Narrative:                 Spoke with patient at bedside briefly this afternoon prior to his going to IR for [facility] placement. Discussed going home with home health services - patient states that prior to admission that he was being set up with [facility]. Will follow up with [facility] in the morning. Patient states that he has all needed DME including wheelchair, walker, 3-N-1, and chair lift for stairs. Patient states that his family will provide transportation home. Anticipate transition home tomorrow. TOC team following.   Expected Discharge Plan: [place] Barriers to Discharge: Continued Medical Work up   Patient Goals and CMS Choice Patient states their goals for this hospitalization and ongoing recovery are:: return home CMS Medicare.gov Compare Post Acute Care list provided to:: Patient Choice offered to / list presented to : Patient  Expected Discharge Plan and Services Expected Discharge Plan: [place]   Discharge Planning Services: CM Consult Post Acute Care Choice: [place] arrangements for the past 2 months: Single Family Home Expected Discharge Date: 12/08/19               DME Arranged: N/A DME Agency: NA       HH Arranged: PT, OT HH Agency: [place]        Prior Living Arrangements/Services Living arrangements for the past 2 months: Single Family Home Lives with:: Self, Adult Children, Siblings Patient language and need for interpreter reviewed:: Yes Do you feel safe going back to the place where you live?: Yes          Current home services: DME Criminal Activity/Legal Involvement Pertinent to Current Situation/Hospitalization: No - Comment  as needed  Activities of Daily Living Home Assistive Devices/Equipment: Environmental consultant (specify type), Wheelchair ADL Screening (condition at time of admission) Patient's cognitive ability adequate to safely complete daily activities?: Yes Is the patient deaf or have difficulty hearing?: No Does the patient have difficulty seeing, even when wearing glasses/contacts?: No Does the patient have difficulty concentrating, remembering, or making decisions?: No Patient able to express need for assistance with ADLs?: Yes Does the patient have difficulty dressing or bathing?: No Independently performs ADLs?: Yes (appropriate for developmental age) Does the patient have difficulty walking or climbing stairs?: Yes Weakness of Legs: Both Weakness of Arms/Hands: None  Permission Sought/Granted Permission sought to share information with : Family Supports Permission granted to share information with : Yes, Verbal Permission Granted              Emotional Assessment Appearance:: Appears stated age Attitude/Demeanor/Rapport: Engaged Affect (typically observed): Accepting Orientation: : Oriented to  Time, Oriented to Self, Oriented to Place, Oriented to Situation Alcohol / Substance Use: Not Applicable Psych Involvement: No (comment)  Admission diagnosis:  Weakness [R53.1] Encounter for imaging to screen for metal prior to MRI [Z13.89] Abscess or cellulitis of foot [L03.119, L02.619] Sepsis ([place]) [A41.9] Patient Active Problem List   Diagnosis Date Noted  . MRSA bacteremia 12/02/2019  . ESRD on dialysis ([place]) 12/02/2019  . Diabetes mellitus with Charcot's joint arthropathy ([place]) 12/02/2019  . Charcot's joint, right ankle and foot 12/02/2019  . Sepsis ([place]) 12/01/2019  PCP:  Nickola Major, MD Pharmacy:   [id number] - [place], [place] [id number] [place] [id number] Phone: [phone] Fax:  [phone]     Social Determinants of Health (SDOH) Interventions    Readmission Risk Interventions No flowsheet data found.

## 2019-12-08 NOTE — Procedures (Signed)
I was present at this dialysis session. I have reviewed the session itself and made appropriate changes.   Temp HD cath to be removed and new tunnel Cath today with IR.  RUnning poorly, alarming today.  AF, WBC normal.  Once TDC placed ok for DC back to outpt HD to complete course of Vancomycyin.    Filed Weights   12/05/19 1633 12/07/19 2019 12/08/19 0700  Weight: 95.1 kg 98.2 kg 97.2 kg    Recent Labs  Lab 12/08/19 0556  NA 131*  K 4.2  CL 94*  CO2 22  GLUCOSE 125*  BUN 76*  CREATININE 10.46*  CALCIUM 7.7*    Recent Labs  Lab 12/01/19 1714 12/01/19 1733 12/05/19 0705 12/06/19 0459 12/08/19 0556  WBC 5.7   < > 5.0 7.1 6.9  NEUTROABS 4.5  --   --   --   --   HGB 9.8*   < > 9.3* 9.1* 9.1*  HCT 31.9*   < > 29.1* 29.3* 29.3*  MCV 82.6   < > 79.3* 82.1 81.8  PLT 86*   < > 121* 136* 148*   < > = values in this interval not displayed.    Scheduled Meds: . Chlorhexidine Gluconate Cloth  6 each Topical Q0600  . Chlorhexidine Gluconate Cloth  6 each Topical Q0600  . feeding supplement (PRO-STAT SUGAR FREE 64)  30 mL Oral BID  . heparin  5,000 Units Subcutaneous Q8H  . midodrine      . midodrine  10 mg Oral TID AC  . multivitamin  1 tablet Oral QHS  . pantoprazole  40 mg Oral Daily  . sevelamer carbonate  800 mg Oral TID WC  . sodium chloride flush  3 mL Intravenous Q12H   Continuous Infusions: . sodium chloride    . sodium chloride    . vancomycin 750 mg (12/08/19 1006)   PRN Meds:.sodium chloride, sodium chloride, acetaminophen, alteplase, benzonatate, heparin, heparin, lidocaine (PF), lidocaine, lidocaine-prilocaine, pentafluoroprop-tetrafluoroeth   Pearson Grippe  MD 12/08/2019, 10:24 AM

## 2019-12-08 NOTE — Progress Notes (Signed)
Hemodialysis completed without issue. Low catheter flows today with frequent alarming. Dr. Joelyn Oms aware. UF 3L without issue. Received vancomycin as ordered. Report called to primary RN. Patient has many requests and questions before discharge. Communicated.

## 2019-12-09 LAB — CULTURE, BLOOD (ROUTINE X 2)
Culture: NO GROWTH
Culture: NO GROWTH
Special Requests: ADEQUATE
Special Requests: ADEQUATE

## 2019-12-09 LAB — COMPREHENSIVE METABOLIC PANEL
ALT: 23 U/L (ref 0–44)
AST: 32 U/L (ref 15–41)
Albumin: 1.8 g/dL — ABNORMAL LOW (ref 3.5–5.0)
Alkaline Phosphatase: 185 U/L — ABNORMAL HIGH (ref 38–126)
Anion gap: 13 (ref 5–15)
BUN: 46 mg/dL — ABNORMAL HIGH (ref 8–23)
CO2: 22 mmol/L (ref 22–32)
Calcium: 7.5 mg/dL — ABNORMAL LOW (ref 8.9–10.3)
Chloride: 93 mmol/L — ABNORMAL LOW (ref 98–111)
Creatinine, Ser: 7.8 mg/dL — ABNORMAL HIGH (ref 0.61–1.24)
GFR calc Af Amer: 8 mL/min — ABNORMAL LOW (ref >60)
GFR calc non Af Amer: 7 mL/min — ABNORMAL LOW (ref >60)
Glucose, Bld: 140 mg/dL — ABNORMAL HIGH (ref 70–99)
Potassium: 3.6 mmol/L (ref 3.5–5.1)
Sodium: 128 mmol/L — ABNORMAL LOW (ref 135–145)
Total Bilirubin: 0.9 mg/dL (ref 0.3–1.2)
Total Protein: 6.8 g/dL (ref 6.5–8.1)

## 2019-12-09 LAB — CBC
HCT: 28.4 % — ABNORMAL LOW (ref 39.0–52.0)
Hemoglobin: 8.6 g/dL — ABNORMAL LOW (ref 13.0–17.0)
MCH: 25.1 pg — ABNORMAL LOW (ref 26.0–34.0)
MCHC: 30.3 g/dL (ref 30.0–36.0)
MCV: 82.8 fL (ref 80.0–100.0)
Platelets: 129 10*3/uL — ABNORMAL LOW (ref 150–400)
RBC: 3.43 MIL/uL — ABNORMAL LOW (ref 4.22–5.81)
RDW: 17.1 % — ABNORMAL HIGH (ref 11.5–15.5)
WBC: 6.3 10*3/uL (ref 4.0–10.5)
nRBC: 0 % (ref 0.0–0.2)

## 2019-12-09 NOTE — Progress Notes (Signed)
Physical Therapy Treatment Patient Details Name: Carlos Hamilton MRN: [id number] DOB: 13-Jun-1956 Today's Date: 12/09/2019    History of Present Illness 64 yo male with onset of Sepsis from MRSA bacteremia from HD port.  Pt has been seen prev for infection in R ankle, with charcot foot.  Pt was cleared for endocarditis, has cirrhosis with pt discussing transplant of liver.  PMHx:  DM, Charcot foot RLE, ESRD, cirrhosis liver with EtOH,  hepatorenal syndrome    PT Comments    Pt was seen for mobility in his room with assistance to BR and to walk in the room.  Pt initially required mod assist to stand including in BR on BSC, but after sitting in chair could do Supervision level sit to stand with arms of chair.  He is very concerned with the recommendations for R charcot foot, which at last consult with MD were to have an amputation.  Pt is considering this, but understandably wants to be independent with mobility.  Recommended him to talk with MD about further questions, as PT is not going to be able to help him decide about the surgery.  Follow up acutely for these needs, focusing on balance and safety on the walker.   Follow Up Recommendations  CIR     Equipment Recommendations  None recommended by PT    Recommendations for Other Services Rehab consult     Precautions / Restrictions Precautions Precautions: Fall Precaution Comments: monitor BP Restrictions Weight Bearing Restrictions: No    Mobility  Bed Mobility Overal bed mobility: Needs Assistance Bed Mobility: Supine to Sit     Supine to sit: Min guard     General bed mobility comments: min guard with help to sequence  Transfers Overall transfer level: Modified independent Equipment used: Rolling walker (2 wheeled);1 person hand held assist Transfers: Sit to/from Stand Sit to Stand: Supervision;Mod assist         General transfer comment: mod to power up initially then supervision from the chairwith armrests to push  up  Ambulation/Gait Ambulation/Gait assistance: Min guard Gait Distance (Feet): 35 Feet Assistive device: Rolling walker (2 wheeled);1 person hand held assist Gait Pattern/deviations: Step-through pattern;Step-to pattern;Decreased stride length;Wide base of support;Trunk flexed Gait velocity: reduced Gait velocity interpretation: <1.31 ft/sec, indicative of household ambulator General Gait Details: knees are more stable today, able to walk without looking like he might buckle   Stairs             Wheelchair Mobility    Modified Rankin (Stroke Patients Only)       Balance Overall balance assessment: Needs assistance Sitting-balance support: Bilateral upper extremity supported;Feet supported Sitting balance-Leahy Scale: Good     Standing balance support: Bilateral upper extremity supported;During functional activity Standing balance-Leahy Scale: Poor Standing balance comment: correction of standing posture and control of walker                            Cognition Arousal/Alertness: Awake/alert Behavior During Therapy: Impulsive Overall Cognitive Status: No family/caregiver present to determine baseline cognitive functioning                                 General Comments: perseverating on source of R foot charcot change,       Exercises      General Comments General comments (skin integrity, edema, etc.): pt was in BR with unsafe transition of hands  from walker to the wall, encouraged pt to use AD and wall rail over hands on the wall      Pertinent Vitals/Pain Pain Assessment: No/denies pain    Home Living                      Prior Function            PT Goals (current goals can now be found in the care plan section) Acute Rehab PT Goals Patient Stated Goal: to go directly home if possible Progress towards PT goals: Progressing toward goals    Frequency    Min 3X/week      PT Plan Current plan remains  appropriate    Co-evaluation              AM-PAC PT "6 Clicks" Mobility   Outcome Measure  Help needed turning from your back to your side while in a flat bed without using bedrails?: A Little Help needed moving from lying on your back to sitting on the side of a flat bed without using bedrails?: A Little Help needed moving to and from a bed to a chair (including a wheelchair)?: A Little Help needed standing up from a chair using your arms (e.g., wheelchair or bedside chair)?: A Little Help needed to walk in hospital room?: A Little Help needed climbing 3-5 steps with a railing? : A Little 6 Click Score: 18    End of Session Equipment Utilized During Treatment: Gait belt Activity Tolerance: Patient limited by fatigue;Treatment limited secondary to medical complications (Comment) Patient left: in chair;with call bell/phone within reach;with chair alarm set Nurse Communication: Mobility status PT Visit Diagnosis: Unsteadiness on feet (R26.81);Muscle weakness (generalized) (M62.81);Difficulty in walking, not elsewhere classified (R26.2);Dizziness and giddiness (R42)     Time: [id number] PT Time Calculation (min) (ACUTE ONLY): 45 min  Charges:  $Gait Training: 8-22 mins $Therapeutic Activity: 23-37 mins                    [name] 12/09/2019, 12:54 PM   [name], PT MS Acute Rehab Dept. Number: [place] and [place]

## 2019-12-09 NOTE — Progress Notes (Signed)
   Subjective: Pt seen at the bedside this morning. States he is feeling well this morning. Looking forward to working with PT today before discharge. Discussed to resume dialysis at his regular outpatient center tomorrow and antibiotic therapy at dialysis till 12/23/2019. Pt expressed understanding of discharge and treatment plan.  Objective:  Vital signs in last 24 hours: Vitals:   12/08/19 1520 12/08/19 2029 12/09/19 0513 12/09/19 0856  BP: 110/68 119/70 115/71 (!) 92/59  Pulse: 76 82 78 70  Resp: 11  18 18   Temp:  98.4 F (36.9 C) 98.2 F (36.8 C) 98 F (36.7 C)  TempSrc:  Oral Oral Oral  SpO2: 96% 94% 97% 99%  Weight:      Height:       Physical Exam Vitals and nursing note reviewed.  Constitutional:      General: He is not in acute distress.    Appearance: He is not ill-appearing.  Pulmonary:     Effort: Pulmonary effort is normal.  Musculoskeletal:     Comments: R Charcot foot deformity  Skin:    General: Skin is warm and dry.     Comments: Chronic venous status changes of bilateral LE's  Neurological:     Mental Status: He is alert.    Assessment/Plan:  Principal Problem:   MRSA bacteremia Active Problems:   Sepsis ([place])   ESRD on dialysis ([place])   Diabetes mellitus with Charcot's joint arthropathy ([place])   Charcot's joint, right ankle and foot  Mr. Borjas is a 64 year old M with significant PMH of hepatorenal syndrome, ESRD on dialysis TTS, diabetes mellitus, and bell's palsy, who presented with AMS, fever,hypotension, and an elevated lactatewith work-up significantfor sepsissecondary toMRSA bacteremia.  MRSA bacteremia Blood cultures from 1/12 positive for MRSA on BCID in 4/4 bottles. Suspect right sided TDC is the infectious source. 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 - resume regular TTS tomorrow at outpatient center  Charcot Foot - discharge delayed yesterday for arrangement of [place] therapy needs to ensure safe - PT to evaluate today - pt to follow-up with orthopedics 1 week after discharge to discuss management options with non-operative treatment vs transtibial amputation  Diet -renal with 1271mL fluid restriction Fluids -none DVT ppx- heparin 5,000 units subQ q8h CODE STATUS- DNR/DNI  Dispo: Anticipated discharge today with [facility]  Ladona Horns, MD 12/09/2019, 2:18 PM Pager: [phone]

## 2019-12-09 NOTE — Progress Notes (Signed)
DISCHARGE NOTE HOME Carlos Hamilton to be discharged Home per MD order. Discussed prescriptions and follow up appointments with the patient. Prescriptions given to patient; medication list explained in detail. Patient verbalized understanding.   IV catheter discontinued intact. Site without signs and symptoms of complications. Dressing and pressure applied. Pt denies pain at the site currently. No complaints noted.   An After Visit Summary (AVS) was printed and given to the patient. Patient escorted via wheelchair, and discharged home via private auto.  Aneta Mins BSN, RN3

## 2019-12-09 NOTE — TOC Transition Note (Signed)
Transition of Care [facility]) - CM/SW Discharge Note   Patient Details  Name: Carlos Hamilton MRN: [id number] Date of Birth: 12-06-1955  Transition of Care [facility]) CM/SW Contact:  Bartholomew Crews, RN Phone Number:  [phone] 12/09/2019, 10:30 AM   Clinical Narrative:    Damaris Schooner with Luellen Pucker at [facility] with referral for PT and OT. Referral accepted with start of care for Monday. Patient aware and agreeable. HH orders received from MD and faxed to [facility] along with facesheet and H&P. Patient to have PT session in the hospital prior to discharge. Family to provide transport home. No further TOC needs identified.    Final next level of care: [place] Barriers to Discharge: No Barriers Identified   Patient Goals and CMS Choice Patient states their goals for this hospitalization and ongoing recovery are:: return home CMS Medicare.gov Compare Post Acute Care list provided to:: Patient Choice offered to / list presented to : Patient  Discharge Placement                       Discharge Plan and Services   Discharge Planning Services: CM Consult Post Acute Care Choice: Home Health          DME Arranged: N/A DME Agency: NA       HH Arranged: PT, OT [place] Agency: [place] Date [place]: 12/09/19 Time [place]: 1030 Representative spoke with at [place]: [place] Determinants of Health ([place]) Interventions     Readmission Risk Interventions No flowsheet data found.

## 2019-12-10 ENCOUNTER — Other Ambulatory Visit: Payer: Self-pay | Admitting: Internal Medicine

## 2019-12-10 MED ORDER — VANCOMYCIN HCL IN DEXTROSE 750-5 MG/150ML-% IV SOLN: 1000.0000 mg | INTRAVENOUS | 0 refills | Status: DC

## 2019-12-10 MED ORDER — VANCOMYCIN HCL IN DEXTROSE 750-5 MG/150ML-% IV SOLN: 1000.0000 mg | INTRAVENOUS | 0 refills | Status: AC

## 2019-12-10 NOTE — Addendum Note (Signed)
Addended by: Ladona Horns on: 12/10/2019 11:29 AM   Modules accepted: Orders

## 2019-12-10 NOTE — Progress Notes (Signed)
Received call from Amy at [facility] with questions about patient's ordered vancomycin. Outpatient order received from MD and faxed to [facility] at [phone]. Also faxed last pharmacy and ID notes.   Manya Silvas, RN MSN CCM Transitions of Care [phone]

## 2019-12-10 NOTE — Progress Notes (Signed)
Updated outpatient HD vancomycin dosing, for 1,000mg  every TTS with HD till 12/23/2019.

## 2019-12-22 DIAGNOSIS — D631 Anemia in chronic kidney disease: Secondary | ICD-10-CM | POA: Diagnosis not present

## 2019-12-22 DIAGNOSIS — N2581 Secondary hyperparathyroidism of renal origin: Secondary | ICD-10-CM | POA: Diagnosis not present

## 2019-12-22 DIAGNOSIS — N186 End stage renal disease: Secondary | ICD-10-CM | POA: Diagnosis not present

## 2019-12-22 DIAGNOSIS — T80219A Unspecified infection due to central venous catheter, initial encounter: Secondary | ICD-10-CM | POA: Diagnosis not present

## 2019-12-22 DIAGNOSIS — R7881 Bacteremia: Secondary | ICD-10-CM | POA: Diagnosis not present

## 2019-12-22 DIAGNOSIS — D509 Iron deficiency anemia, unspecified: Secondary | ICD-10-CM | POA: Diagnosis not present

## 2019-12-24 DIAGNOSIS — E11621 Type 2 diabetes mellitus with foot ulcer: Secondary | ICD-10-CM | POA: Diagnosis not present

## 2019-12-25 DIAGNOSIS — F1020 Alcohol dependence, uncomplicated: Secondary | ICD-10-CM | POA: Diagnosis not present

## 2019-12-25 DIAGNOSIS — B999 Unspecified infectious disease: Secondary | ICD-10-CM | POA: Diagnosis not present

## 2019-12-25 DIAGNOSIS — K7460 Unspecified cirrhosis of liver: Secondary | ICD-10-CM | POA: Diagnosis not present

## 2019-12-25 DIAGNOSIS — K766 Portal hypertension: Secondary | ICD-10-CM | POA: Diagnosis not present

## 2019-12-25 DIAGNOSIS — I120 Hypertensive chronic kidney disease with stage 5 chronic kidney disease or end stage renal disease: Secondary | ICD-10-CM | POA: Diagnosis not present

## 2019-12-25 DIAGNOSIS — I8390 Asymptomatic varicose veins of unspecified lower extremity: Secondary | ICD-10-CM | POA: Diagnosis not present

## 2019-12-25 DIAGNOSIS — Z992 Dependence on renal dialysis: Secondary | ICD-10-CM | POA: Diagnosis not present

## 2019-12-25 DIAGNOSIS — N186 End stage renal disease: Secondary | ICD-10-CM | POA: Diagnosis not present

## 2019-12-25 DIAGNOSIS — R140 Abdominal distension (gaseous): Secondary | ICD-10-CM | POA: Diagnosis not present

## 2019-12-25 DIAGNOSIS — G4733 Obstructive sleep apnea (adult) (pediatric): Secondary | ICD-10-CM | POA: Diagnosis not present

## 2019-12-25 DIAGNOSIS — E119 Type 2 diabetes mellitus without complications: Secondary | ICD-10-CM | POA: Diagnosis not present

## 2019-12-25 DIAGNOSIS — K7290 Hepatic failure, unspecified without coma: Secondary | ICD-10-CM | POA: Diagnosis not present

## 2019-12-25 DIAGNOSIS — E785 Hyperlipidemia, unspecified: Secondary | ICD-10-CM | POA: Diagnosis not present

## 2019-12-25 DIAGNOSIS — K7031 Alcoholic cirrhosis of liver with ascites: Secondary | ICD-10-CM | POA: Diagnosis not present

## 2019-12-28 DIAGNOSIS — E11610 Type 2 diabetes mellitus with diabetic neuropathic arthropathy: Secondary | ICD-10-CM | POA: Diagnosis not present

## 2019-12-28 DIAGNOSIS — L97519 Non-pressure chronic ulcer of other part of right foot with unspecified severity: Secondary | ICD-10-CM | POA: Diagnosis not present

## 2019-12-28 DIAGNOSIS — E11621 Type 2 diabetes mellitus with foot ulcer: Secondary | ICD-10-CM | POA: Diagnosis not present

## 2019-12-28 DIAGNOSIS — N186 End stage renal disease: Secondary | ICD-10-CM | POA: Diagnosis not present

## 2019-12-28 DIAGNOSIS — M25374 Other instability, right foot: Secondary | ICD-10-CM | POA: Diagnosis not present

## 2019-12-28 DIAGNOSIS — M14671 Charcot's joint, right ankle and foot: Secondary | ICD-10-CM | POA: Diagnosis not present

## 2019-12-28 DIAGNOSIS — M6701 Short Achilles tendon (acquired), right ankle: Secondary | ICD-10-CM | POA: Diagnosis not present

## 2019-12-28 DIAGNOSIS — G4733 Obstructive sleep apnea (adult) (pediatric): Secondary | ICD-10-CM | POA: Diagnosis not present

## 2019-12-30 DIAGNOSIS — N186 End stage renal disease: Secondary | ICD-10-CM | POA: Diagnosis not present

## 2019-12-30 DIAGNOSIS — R159 Full incontinence of feces: Secondary | ICD-10-CM | POA: Diagnosis not present

## 2019-12-30 DIAGNOSIS — Z992 Dependence on renal dialysis: Secondary | ICD-10-CM | POA: Diagnosis not present

## 2019-12-30 DIAGNOSIS — M14671 Charcot's joint, right ankle and foot: Secondary | ICD-10-CM | POA: Diagnosis not present

## 2019-12-30 DIAGNOSIS — K7031 Alcoholic cirrhosis of liver with ascites: Secondary | ICD-10-CM | POA: Diagnosis not present

## 2019-12-30 DIAGNOSIS — I959 Hypotension, unspecified: Secondary | ICD-10-CM | POA: Diagnosis not present

## 2019-12-30 DIAGNOSIS — R152 Fecal urgency: Secondary | ICD-10-CM | POA: Diagnosis not present

## 2019-12-31 ENCOUNTER — Other Ambulatory Visit: Payer: Self-pay | Admitting: Family Medicine

## 2019-12-31 DIAGNOSIS — K7031 Alcoholic cirrhosis of liver with ascites: Secondary | ICD-10-CM

## 2020-01-06 DIAGNOSIS — R188 Other ascites: Secondary | ICD-10-CM | POA: Diagnosis not present

## 2020-01-06 DIAGNOSIS — Z20822 Contact with and (suspected) exposure to covid-19: Secondary | ICD-10-CM | POA: Diagnosis not present

## 2020-01-06 DIAGNOSIS — Z01812 Encounter for preprocedural laboratory examination: Secondary | ICD-10-CM | POA: Diagnosis not present

## 2020-01-07 DIAGNOSIS — I120 Hypertensive chronic kidney disease with stage 5 chronic kidney disease or end stage renal disease: Secondary | ICD-10-CM | POA: Diagnosis not present

## 2020-01-07 DIAGNOSIS — I440 Atrioventricular block, first degree: Secondary | ICD-10-CM | POA: Diagnosis not present

## 2020-01-07 DIAGNOSIS — R9431 Abnormal electrocardiogram [ECG] [EKG]: Secondary | ICD-10-CM | POA: Diagnosis not present

## 2020-01-07 DIAGNOSIS — R140 Abdominal distension (gaseous): Secondary | ICD-10-CM | POA: Diagnosis not present

## 2020-01-07 DIAGNOSIS — E119 Type 2 diabetes mellitus without complications: Secondary | ICD-10-CM | POA: Diagnosis not present

## 2020-01-07 DIAGNOSIS — R112 Nausea with vomiting, unspecified: Secondary | ICD-10-CM | POA: Diagnosis not present

## 2020-01-07 DIAGNOSIS — K7031 Alcoholic cirrhosis of liver with ascites: Secondary | ICD-10-CM | POA: Diagnosis not present

## 2020-01-07 DIAGNOSIS — Z20822 Contact with and (suspected) exposure to covid-19: Secondary | ICD-10-CM | POA: Diagnosis not present

## 2020-01-07 DIAGNOSIS — I953 Hypotension of hemodialysis: Secondary | ICD-10-CM | POA: Diagnosis not present

## 2020-01-07 DIAGNOSIS — Z992 Dependence on renal dialysis: Secondary | ICD-10-CM | POA: Diagnosis not present

## 2020-01-07 DIAGNOSIS — K56609 Unspecified intestinal obstruction, unspecified as to partial versus complete obstruction: Secondary | ICD-10-CM | POA: Diagnosis not present

## 2020-01-07 DIAGNOSIS — R1084 Generalized abdominal pain: Secondary | ICD-10-CM | POA: Diagnosis not present

## 2020-01-07 DIAGNOSIS — G4733 Obstructive sleep apnea (adult) (pediatric): Secondary | ICD-10-CM | POA: Diagnosis not present

## 2020-01-07 DIAGNOSIS — D631 Anemia in chronic kidney disease: Secondary | ICD-10-CM | POA: Diagnosis not present

## 2020-01-07 DIAGNOSIS — K8020 Calculus of gallbladder without cholecystitis without obstruction: Secondary | ICD-10-CM | POA: Diagnosis not present

## 2020-01-07 DIAGNOSIS — K420 Umbilical hernia with obstruction, without gangrene: Secondary | ICD-10-CM | POA: Diagnosis not present

## 2020-01-07 DIAGNOSIS — E785 Hyperlipidemia, unspecified: Secondary | ICD-10-CM | POA: Diagnosis not present

## 2020-01-07 DIAGNOSIS — R52 Pain, unspecified: Secondary | ICD-10-CM | POA: Diagnosis not present

## 2020-01-07 DIAGNOSIS — D61818 Other pancytopenia: Secondary | ICD-10-CM | POA: Diagnosis not present

## 2020-01-07 DIAGNOSIS — I4430 Unspecified atrioventricular block: Secondary | ICD-10-CM | POA: Diagnosis not present

## 2020-01-07 DIAGNOSIS — R188 Other ascites: Secondary | ICD-10-CM | POA: Diagnosis not present

## 2020-01-07 DIAGNOSIS — D696 Thrombocytopenia, unspecified: Secondary | ICD-10-CM | POA: Diagnosis not present

## 2020-01-07 DIAGNOSIS — N186 End stage renal disease: Secondary | ICD-10-CM | POA: Diagnosis not present

## 2020-01-07 DIAGNOSIS — Z4682 Encounter for fitting and adjustment of non-vascular catheter: Secondary | ICD-10-CM | POA: Diagnosis not present

## 2020-01-07 DIAGNOSIS — I491 Atrial premature depolarization: Secondary | ICD-10-CM | POA: Diagnosis not present

## 2020-01-07 DIAGNOSIS — R1033 Periumbilical pain: Secondary | ICD-10-CM | POA: Diagnosis not present

## 2020-01-07 DIAGNOSIS — E1122 Type 2 diabetes mellitus with diabetic chronic kidney disease: Secondary | ICD-10-CM | POA: Diagnosis not present

## 2020-01-07 DIAGNOSIS — I498 Other specified cardiac arrhythmias: Secondary | ICD-10-CM | POA: Diagnosis not present

## 2020-01-07 DIAGNOSIS — K5669 Other partial intestinal obstruction: Secondary | ICD-10-CM | POA: Diagnosis not present

## 2020-01-07 DIAGNOSIS — I9589 Other hypotension: Secondary | ICD-10-CM | POA: Diagnosis not present

## 2020-01-07 DIAGNOSIS — I272 Pulmonary hypertension, unspecified: Secondary | ICD-10-CM | POA: Diagnosis not present

## 2020-01-11 ENCOUNTER — Ambulatory Visit: Payer: BLUE CROSS/BLUE SHIELD | Admitting: Internal Medicine

## 2020-01-11 DIAGNOSIS — K7031 Alcoholic cirrhosis of liver with ascites: Secondary | ICD-10-CM | POA: Diagnosis not present

## 2020-01-13 DIAGNOSIS — K319 Disease of stomach and duodenum, unspecified: Secondary | ICD-10-CM | POA: Diagnosis not present

## 2020-01-13 DIAGNOSIS — K2900 Acute gastritis without bleeding: Secondary | ICD-10-CM | POA: Diagnosis not present

## 2020-01-13 DIAGNOSIS — K7031 Alcoholic cirrhosis of liver with ascites: Secondary | ICD-10-CM | POA: Diagnosis not present

## 2020-01-13 DIAGNOSIS — K3189 Other diseases of stomach and duodenum: Secondary | ICD-10-CM | POA: Diagnosis not present

## 2020-01-13 DIAGNOSIS — K766 Portal hypertension: Secondary | ICD-10-CM | POA: Diagnosis not present

## 2020-01-13 DIAGNOSIS — Z13810 Encounter for screening for upper gastrointestinal disorder: Secondary | ICD-10-CM | POA: Diagnosis not present

## 2020-01-13 DIAGNOSIS — K2980 Duodenitis without bleeding: Secondary | ICD-10-CM | POA: Diagnosis not present

## 2020-01-17 DIAGNOSIS — N186 End stage renal disease: Secondary | ICD-10-CM | POA: Diagnosis not present

## 2020-01-17 DIAGNOSIS — Z992 Dependence on renal dialysis: Secondary | ICD-10-CM | POA: Diagnosis not present

## 2020-01-19 DIAGNOSIS — T8249XA Other complication of vascular dialysis catheter, initial encounter: Secondary | ICD-10-CM | POA: Diagnosis not present

## 2020-01-19 DIAGNOSIS — D631 Anemia in chronic kidney disease: Secondary | ICD-10-CM | POA: Diagnosis not present

## 2020-01-19 DIAGNOSIS — D509 Iron deficiency anemia, unspecified: Secondary | ICD-10-CM | POA: Diagnosis not present

## 2020-01-19 DIAGNOSIS — N186 End stage renal disease: Secondary | ICD-10-CM | POA: Diagnosis not present

## 2020-01-19 DIAGNOSIS — E8779 Other fluid overload: Secondary | ICD-10-CM | POA: Diagnosis not present

## 2020-01-21 DIAGNOSIS — E11621 Type 2 diabetes mellitus with foot ulcer: Secondary | ICD-10-CM | POA: Diagnosis not present

## 2020-01-25 DIAGNOSIS — K7031 Alcoholic cirrhosis of liver with ascites: Secondary | ICD-10-CM | POA: Diagnosis not present

## 2020-01-29 DIAGNOSIS — Z992 Dependence on renal dialysis: Secondary | ICD-10-CM | POA: Diagnosis not present

## 2020-01-29 DIAGNOSIS — E559 Vitamin D deficiency, unspecified: Secondary | ICD-10-CM | POA: Diagnosis not present

## 2020-01-29 DIAGNOSIS — M14671 Charcot's joint, right ankle and foot: Secondary | ICD-10-CM | POA: Diagnosis not present

## 2020-01-29 DIAGNOSIS — K7031 Alcoholic cirrhosis of liver with ascites: Secondary | ICD-10-CM | POA: Diagnosis not present

## 2020-01-29 DIAGNOSIS — N186 End stage renal disease: Secondary | ICD-10-CM | POA: Diagnosis not present

## 2020-01-30 DIAGNOSIS — K429 Umbilical hernia without obstruction or gangrene: Secondary | ICD-10-CM | POA: Diagnosis not present

## 2020-01-30 DIAGNOSIS — E785 Hyperlipidemia, unspecified: Secondary | ICD-10-CM | POA: Diagnosis not present

## 2020-01-30 DIAGNOSIS — N186 End stage renal disease: Secondary | ICD-10-CM | POA: Diagnosis not present

## 2020-01-30 DIAGNOSIS — I491 Atrial premature depolarization: Secondary | ICD-10-CM | POA: Diagnosis not present

## 2020-01-30 DIAGNOSIS — D631 Anemia in chronic kidney disease: Secondary | ICD-10-CM | POA: Diagnosis not present

## 2020-01-30 DIAGNOSIS — Z66 Do not resuscitate: Secondary | ICD-10-CM | POA: Diagnosis not present

## 2020-01-30 DIAGNOSIS — K9189 Other postprocedural complications and disorders of digestive system: Secondary | ICD-10-CM | POA: Diagnosis not present

## 2020-01-30 DIAGNOSIS — F1010 Alcohol abuse, uncomplicated: Secondary | ICD-10-CM | POA: Diagnosis not present

## 2020-01-30 DIAGNOSIS — K56609 Unspecified intestinal obstruction, unspecified as to partial versus complete obstruction: Secondary | ICD-10-CM | POA: Diagnosis not present

## 2020-01-30 DIAGNOSIS — Z992 Dependence on renal dialysis: Secondary | ICD-10-CM | POA: Diagnosis not present

## 2020-01-30 DIAGNOSIS — K7460 Unspecified cirrhosis of liver: Secondary | ICD-10-CM | POA: Diagnosis not present

## 2020-01-30 DIAGNOSIS — E1122 Type 2 diabetes mellitus with diabetic chronic kidney disease: Secondary | ICD-10-CM | POA: Diagnosis not present

## 2020-01-30 DIAGNOSIS — I440 Atrioventricular block, first degree: Secondary | ICD-10-CM | POA: Diagnosis not present

## 2020-01-30 DIAGNOSIS — K56601 Complete intestinal obstruction, unspecified as to cause: Secondary | ICD-10-CM | POA: Diagnosis not present

## 2020-01-30 DIAGNOSIS — I9589 Other hypotension: Secondary | ICD-10-CM | POA: Diagnosis not present

## 2020-01-30 DIAGNOSIS — Z20822 Contact with and (suspected) exposure to covid-19: Secondary | ICD-10-CM | POA: Diagnosis not present

## 2020-01-30 DIAGNOSIS — E871 Hypo-osmolality and hyponatremia: Secondary | ICD-10-CM | POA: Diagnosis not present

## 2020-01-30 DIAGNOSIS — K469 Unspecified abdominal hernia without obstruction or gangrene: Secondary | ICD-10-CM | POA: Diagnosis not present

## 2020-01-30 DIAGNOSIS — R188 Other ascites: Secondary | ICD-10-CM | POA: Diagnosis not present

## 2020-01-30 DIAGNOSIS — K56691 Other complete intestinal obstruction: Secondary | ICD-10-CM | POA: Diagnosis not present

## 2020-01-30 DIAGNOSIS — K8020 Calculus of gallbladder without cholecystitis without obstruction: Secondary | ICD-10-CM | POA: Diagnosis not present

## 2020-01-30 DIAGNOSIS — K7031 Alcoholic cirrhosis of liver with ascites: Secondary | ICD-10-CM | POA: Diagnosis not present

## 2020-01-30 DIAGNOSIS — R1084 Generalized abdominal pain: Secondary | ICD-10-CM | POA: Diagnosis not present

## 2020-01-30 DIAGNOSIS — E875 Hyperkalemia: Secondary | ICD-10-CM | POA: Diagnosis not present

## 2020-01-30 DIAGNOSIS — K420 Umbilical hernia with obstruction, without gangrene: Secondary | ICD-10-CM | POA: Diagnosis not present

## 2020-01-30 DIAGNOSIS — K567 Ileus, unspecified: Secondary | ICD-10-CM | POA: Diagnosis not present

## 2020-01-30 DIAGNOSIS — F1021 Alcohol dependence, in remission: Secondary | ICD-10-CM | POA: Diagnosis not present

## 2020-01-30 DIAGNOSIS — K7011 Alcoholic hepatitis with ascites: Secondary | ICD-10-CM | POA: Diagnosis not present

## 2020-01-30 DIAGNOSIS — F419 Anxiety disorder, unspecified: Secondary | ICD-10-CM | POA: Diagnosis not present

## 2020-01-30 DIAGNOSIS — K660 Peritoneal adhesions (postprocedural) (postinfection): Secondary | ICD-10-CM | POA: Diagnosis not present

## 2020-01-30 DIAGNOSIS — I959 Hypotension, unspecified: Secondary | ICD-10-CM | POA: Diagnosis not present

## 2020-01-30 DIAGNOSIS — I120 Hypertensive chronic kidney disease with stage 5 chronic kidney disease or end stage renal disease: Secondary | ICD-10-CM | POA: Diagnosis not present

## 2020-01-30 DIAGNOSIS — E11610 Type 2 diabetes mellitus with diabetic neuropathic arthropathy: Secondary | ICD-10-CM | POA: Diagnosis not present

## 2020-01-30 DIAGNOSIS — K430 Incisional hernia with obstruction, without gangrene: Secondary | ICD-10-CM | POA: Diagnosis not present

## 2020-01-30 DIAGNOSIS — I1311 Hypertensive heart and chronic kidney disease without heart failure, with stage 5 chronic kidney disease, or end stage renal disease: Secondary | ICD-10-CM | POA: Diagnosis not present

## 2020-01-30 DIAGNOSIS — G4733 Obstructive sleep apnea (adult) (pediatric): Secondary | ICD-10-CM | POA: Diagnosis not present

## 2020-02-12 DIAGNOSIS — N186 End stage renal disease: Secondary | ICD-10-CM | POA: Diagnosis not present

## 2020-02-12 DIAGNOSIS — Z992 Dependence on renal dialysis: Secondary | ICD-10-CM | POA: Diagnosis not present

## 2020-02-12 DIAGNOSIS — K7031 Alcoholic cirrhosis of liver with ascites: Secondary | ICD-10-CM | POA: Diagnosis not present

## 2020-02-12 DIAGNOSIS — E1122 Type 2 diabetes mellitus with diabetic chronic kidney disease: Secondary | ICD-10-CM | POA: Diagnosis not present

## 2020-02-12 DIAGNOSIS — I120 Hypertensive chronic kidney disease with stage 5 chronic kidney disease or end stage renal disease: Secondary | ICD-10-CM | POA: Diagnosis not present

## 2020-02-15 ENCOUNTER — Ambulatory Visit: Payer: Self-pay | Attending: Internal Medicine

## 2020-02-15 DIAGNOSIS — K7031 Alcoholic cirrhosis of liver with ascites: Secondary | ICD-10-CM | POA: Diagnosis not present

## 2020-02-15 DIAGNOSIS — Z23 Encounter for immunization: Secondary | ICD-10-CM

## 2020-02-15 NOTE — Progress Notes (Signed)
   Covid-19 Vaccination Clinic  Name:  Aland Chestnutt    MRN: [id number] DOB: 04-13-1956  02/15/2020  Mr. Wedge was observed post Covid-19 immunization for 15 minutes without incident. He was provided with Vaccine Information Sheet and instruction to access the V-Safe system.   Mr. Chapdelaine was instructed to call 911 with any severe reactions post vaccine: Marland Kitchen Difficulty breathing  . Swelling of face and throat  . A fast heartbeat  . A bad rash all over body  . Dizziness and weakness   Immunizations Administered    Name Date Dose VIS Date Route   Pfizer COVID-19 Vaccine 02/15/2020 10:51 AM 0.3 mL 10/30/2019 Intramuscular   Manufacturer: [place]   Lot: [id number]   [place]: [id number]

## 2020-02-17 DIAGNOSIS — N186 End stage renal disease: Secondary | ICD-10-CM | POA: Diagnosis not present

## 2020-02-17 DIAGNOSIS — Z992 Dependence on renal dialysis: Secondary | ICD-10-CM | POA: Diagnosis not present

## 2020-02-18 DIAGNOSIS — T8249XA Other complication of vascular dialysis catheter, initial encounter: Secondary | ICD-10-CM | POA: Diagnosis not present

## 2020-02-18 DIAGNOSIS — N186 End stage renal disease: Secondary | ICD-10-CM | POA: Diagnosis not present

## 2020-02-18 DIAGNOSIS — I10 Essential (primary) hypertension: Secondary | ICD-10-CM | POA: Diagnosis not present

## 2020-02-18 DIAGNOSIS — D509 Iron deficiency anemia, unspecified: Secondary | ICD-10-CM | POA: Diagnosis not present

## 2020-02-18 DIAGNOSIS — D631 Anemia in chronic kidney disease: Secondary | ICD-10-CM | POA: Diagnosis not present

## 2020-02-18 DIAGNOSIS — N2581 Secondary hyperparathyroidism of renal origin: Secondary | ICD-10-CM | POA: Diagnosis not present

## 2020-02-24 DIAGNOSIS — K7460 Unspecified cirrhosis of liver: Secondary | ICD-10-CM | POA: Diagnosis not present

## 2020-02-24 DIAGNOSIS — K7031 Alcoholic cirrhosis of liver with ascites: Secondary | ICD-10-CM | POA: Diagnosis not present

## 2020-02-24 DIAGNOSIS — R188 Other ascites: Secondary | ICD-10-CM | POA: Diagnosis not present

## 2020-02-25 DIAGNOSIS — E11621 Type 2 diabetes mellitus with foot ulcer: Secondary | ICD-10-CM | POA: Diagnosis not present

## 2020-02-25 DIAGNOSIS — I10 Essential (primary) hypertension: Secondary | ICD-10-CM | POA: Diagnosis not present

## 2020-02-29 DIAGNOSIS — M6701 Short Achilles tendon (acquired), right ankle: Secondary | ICD-10-CM | POA: Diagnosis not present

## 2020-02-29 DIAGNOSIS — M25374 Other instability, right foot: Secondary | ICD-10-CM | POA: Diagnosis not present

## 2020-02-29 DIAGNOSIS — M21961 Unspecified acquired deformity of right lower leg: Secondary | ICD-10-CM | POA: Diagnosis not present

## 2020-02-29 DIAGNOSIS — M14671 Charcot's joint, right ankle and foot: Secondary | ICD-10-CM | POA: Diagnosis not present

## 2020-03-01 DIAGNOSIS — N186 End stage renal disease: Secondary | ICD-10-CM | POA: Diagnosis not present

## 2020-03-01 DIAGNOSIS — Z992 Dependence on renal dialysis: Secondary | ICD-10-CM | POA: Diagnosis not present

## 2020-03-02 DIAGNOSIS — E11621 Type 2 diabetes mellitus with foot ulcer: Secondary | ICD-10-CM | POA: Diagnosis not present

## 2020-03-02 DIAGNOSIS — E119 Type 2 diabetes mellitus without complications: Secondary | ICD-10-CM | POA: Diagnosis not present

## 2020-03-02 DIAGNOSIS — S80211A Abrasion, right knee, initial encounter: Secondary | ICD-10-CM | POA: Diagnosis not present

## 2020-03-02 DIAGNOSIS — K7460 Unspecified cirrhosis of liver: Secondary | ICD-10-CM | POA: Diagnosis not present

## 2020-03-02 DIAGNOSIS — E538 Deficiency of other specified B group vitamins: Secondary | ICD-10-CM | POA: Diagnosis not present

## 2020-03-02 DIAGNOSIS — E11610 Type 2 diabetes mellitus with diabetic neuropathic arthropathy: Secondary | ICD-10-CM | POA: Diagnosis not present

## 2020-03-02 DIAGNOSIS — D61818 Other pancytopenia: Secondary | ICD-10-CM | POA: Diagnosis not present

## 2020-03-02 DIAGNOSIS — L97511 Non-pressure chronic ulcer of other part of right foot limited to breakdown of skin: Secondary | ICD-10-CM | POA: Diagnosis not present

## 2020-03-02 DIAGNOSIS — R188 Other ascites: Secondary | ICD-10-CM | POA: Diagnosis not present

## 2020-03-02 DIAGNOSIS — D696 Thrombocytopenia, unspecified: Secondary | ICD-10-CM | POA: Diagnosis not present

## 2020-03-06 DIAGNOSIS — E785 Hyperlipidemia, unspecified: Secondary | ICD-10-CM | POA: Diagnosis not present

## 2020-03-06 DIAGNOSIS — R6521 Severe sepsis with septic shock: Secondary | ICD-10-CM | POA: Diagnosis not present

## 2020-03-06 DIAGNOSIS — E872 Acidosis: Secondary | ICD-10-CM | POA: Diagnosis not present

## 2020-03-06 DIAGNOSIS — M25471 Effusion, right ankle: Secondary | ICD-10-CM | POA: Diagnosis not present

## 2020-03-06 DIAGNOSIS — A4189 Other specified sepsis: Secondary | ICD-10-CM | POA: Diagnosis not present

## 2020-03-06 DIAGNOSIS — I959 Hypotension, unspecified: Secondary | ICD-10-CM | POA: Diagnosis not present

## 2020-03-06 DIAGNOSIS — I214 Non-ST elevation (NSTEMI) myocardial infarction: Secondary | ICD-10-CM | POA: Diagnosis not present

## 2020-03-06 DIAGNOSIS — R4182 Altered mental status, unspecified: Secondary | ICD-10-CM | POA: Diagnosis not present

## 2020-03-06 DIAGNOSIS — K7031 Alcoholic cirrhosis of liver with ascites: Secondary | ICD-10-CM | POA: Diagnosis not present

## 2020-03-06 DIAGNOSIS — K7030 Alcoholic cirrhosis of liver without ascites: Secondary | ICD-10-CM | POA: Diagnosis not present

## 2020-03-06 DIAGNOSIS — A4102 Sepsis due to Methicillin resistant Staphylococcus aureus: Secondary | ICD-10-CM | POA: Diagnosis not present

## 2020-03-06 DIAGNOSIS — R188 Other ascites: Secondary | ICD-10-CM | POA: Diagnosis not present

## 2020-03-06 DIAGNOSIS — M6281 Muscle weakness (generalized): Secondary | ICD-10-CM | POA: Diagnosis not present

## 2020-03-06 DIAGNOSIS — I480 Paroxysmal atrial fibrillation: Secondary | ICD-10-CM | POA: Diagnosis not present

## 2020-03-06 DIAGNOSIS — E1140 Type 2 diabetes mellitus with diabetic neuropathy, unspecified: Secondary | ICD-10-CM | POA: Diagnosis not present

## 2020-03-06 DIAGNOSIS — F1010 Alcohol abuse, uncomplicated: Secondary | ICD-10-CM | POA: Diagnosis not present

## 2020-03-06 DIAGNOSIS — R748 Abnormal levels of other serum enzymes: Secondary | ICD-10-CM | POA: Diagnosis not present

## 2020-03-06 DIAGNOSIS — Z992 Dependence on renal dialysis: Secondary | ICD-10-CM | POA: Diagnosis not present

## 2020-03-06 DIAGNOSIS — Z7401 Bed confinement status: Secondary | ICD-10-CM | POA: Diagnosis not present

## 2020-03-06 DIAGNOSIS — D6959 Other secondary thrombocytopenia: Secondary | ICD-10-CM | POA: Diagnosis not present

## 2020-03-06 DIAGNOSIS — R7881 Bacteremia: Secondary | ICD-10-CM | POA: Diagnosis not present

## 2020-03-06 DIAGNOSIS — I9589 Other hypotension: Secondary | ICD-10-CM | POA: Diagnosis not present

## 2020-03-06 DIAGNOSIS — I120 Hypertensive chronic kidney disease with stage 5 chronic kidney disease or end stage renal disease: Secondary | ICD-10-CM | POA: Diagnosis not present

## 2020-03-06 DIAGNOSIS — A411 Sepsis due to other specified staphylococcus: Secondary | ICD-10-CM | POA: Diagnosis not present

## 2020-03-06 DIAGNOSIS — E11610 Type 2 diabetes mellitus with diabetic neuropathic arthropathy: Secondary | ICD-10-CM | POA: Diagnosis not present

## 2020-03-06 DIAGNOSIS — F1020 Alcohol dependence, uncomplicated: Secondary | ICD-10-CM | POA: Diagnosis not present

## 2020-03-06 DIAGNOSIS — R918 Other nonspecific abnormal finding of lung field: Secondary | ICD-10-CM | POA: Diagnosis not present

## 2020-03-06 DIAGNOSIS — E1169 Type 2 diabetes mellitus with other specified complication: Secondary | ICD-10-CM | POA: Diagnosis not present

## 2020-03-06 DIAGNOSIS — G9341 Metabolic encephalopathy: Secondary | ICD-10-CM | POA: Diagnosis not present

## 2020-03-06 DIAGNOSIS — E11621 Type 2 diabetes mellitus with foot ulcer: Secondary | ICD-10-CM | POA: Diagnosis not present

## 2020-03-06 DIAGNOSIS — A419 Sepsis, unspecified organism: Secondary | ICD-10-CM | POA: Diagnosis not present

## 2020-03-06 DIAGNOSIS — K7290 Hepatic failure, unspecified without coma: Secondary | ICD-10-CM | POA: Diagnosis not present

## 2020-03-06 DIAGNOSIS — R609 Edema, unspecified: Secondary | ICD-10-CM | POA: Diagnosis not present

## 2020-03-06 DIAGNOSIS — M14671 Charcot's joint, right ankle and foot: Secondary | ICD-10-CM | POA: Diagnosis not present

## 2020-03-06 DIAGNOSIS — E1122 Type 2 diabetes mellitus with diabetic chronic kidney disease: Secondary | ICD-10-CM | POA: Diagnosis not present

## 2020-03-06 DIAGNOSIS — R7989 Other specified abnormal findings of blood chemistry: Secondary | ICD-10-CM | POA: Diagnosis not present

## 2020-03-06 DIAGNOSIS — M7989 Other specified soft tissue disorders: Secondary | ICD-10-CM | POA: Diagnosis not present

## 2020-03-06 DIAGNOSIS — I4891 Unspecified atrial fibrillation: Secondary | ICD-10-CM | POA: Diagnosis not present

## 2020-03-06 DIAGNOSIS — D696 Thrombocytopenia, unspecified: Secondary | ICD-10-CM | POA: Diagnosis not present

## 2020-03-06 DIAGNOSIS — M2550 Pain in unspecified joint: Secondary | ICD-10-CM | POA: Diagnosis not present

## 2020-03-06 DIAGNOSIS — I219 Acute myocardial infarction, unspecified: Secondary | ICD-10-CM | POA: Diagnosis not present

## 2020-03-06 DIAGNOSIS — R5383 Other fatigue: Secondary | ICD-10-CM | POA: Diagnosis not present

## 2020-03-06 DIAGNOSIS — R238 Other skin changes: Secondary | ICD-10-CM | POA: Diagnosis not present

## 2020-03-06 DIAGNOSIS — D61818 Other pancytopenia: Secondary | ICD-10-CM | POA: Diagnosis not present

## 2020-03-06 DIAGNOSIS — K7460 Unspecified cirrhosis of liver: Secondary | ICD-10-CM | POA: Diagnosis not present

## 2020-03-06 DIAGNOSIS — R61 Generalized hyperhidrosis: Secondary | ICD-10-CM | POA: Diagnosis not present

## 2020-03-06 DIAGNOSIS — M86171 Other acute osteomyelitis, right ankle and foot: Secondary | ICD-10-CM | POA: Diagnosis not present

## 2020-03-06 DIAGNOSIS — R6520 Severe sepsis without septic shock: Secondary | ICD-10-CM | POA: Diagnosis not present

## 2020-03-06 DIAGNOSIS — Z515 Encounter for palliative care: Secondary | ICD-10-CM | POA: Diagnosis not present

## 2020-03-06 DIAGNOSIS — I953 Hypotension of hemodialysis: Secondary | ICD-10-CM | POA: Diagnosis not present

## 2020-03-06 DIAGNOSIS — I1311 Hypertensive heart and chronic kidney disease without heart failure, with stage 5 chronic kidney disease, or end stage renal disease: Secondary | ICD-10-CM | POA: Diagnosis not present

## 2020-03-06 DIAGNOSIS — R0902 Hypoxemia: Secondary | ICD-10-CM | POA: Diagnosis not present

## 2020-03-06 DIAGNOSIS — L97519 Non-pressure chronic ulcer of other part of right foot with unspecified severity: Secondary | ICD-10-CM | POA: Diagnosis not present

## 2020-03-06 DIAGNOSIS — R000 Tachycardia, unspecified: Secondary | ICD-10-CM | POA: Diagnosis not present

## 2020-03-06 DIAGNOSIS — D631 Anemia in chronic kidney disease: Secondary | ICD-10-CM | POA: Diagnosis not present

## 2020-03-06 DIAGNOSIS — E871 Hypo-osmolality and hyponatremia: Secondary | ICD-10-CM | POA: Diagnosis not present

## 2020-03-06 DIAGNOSIS — N186 End stage renal disease: Secondary | ICD-10-CM | POA: Diagnosis not present

## 2020-03-06 DIAGNOSIS — K766 Portal hypertension: Secondary | ICD-10-CM | POA: Diagnosis not present

## 2020-03-06 DIAGNOSIS — S91301A Unspecified open wound, right foot, initial encounter: Secondary | ICD-10-CM | POA: Diagnosis not present

## 2020-03-06 DIAGNOSIS — E7800 Pure hypercholesterolemia, unspecified: Secondary | ICD-10-CM | POA: Diagnosis not present

## 2020-03-06 DIAGNOSIS — Z66 Do not resuscitate: Secondary | ICD-10-CM | POA: Diagnosis not present

## 2020-03-06 DIAGNOSIS — Z20822 Contact with and (suspected) exposure to covid-19: Secondary | ICD-10-CM | POA: Diagnosis not present

## 2020-03-06 DIAGNOSIS — R531 Weakness: Secondary | ICD-10-CM | POA: Diagnosis not present

## 2020-03-09 ENCOUNTER — Ambulatory Visit: Payer: Self-pay

## 2020-03-11 ENCOUNTER — Other Ambulatory Visit: Payer: BLUE CROSS/BLUE SHIELD

## 2020-03-19 DEATH — deceased

## 2020-12-15 IMAGING — MR MR FOOT*R* W/O CM
5 series · 40 of 40 positions shown · non-contrast
Comparison: June 19, 2019

CLINICAL DATA: Right foot swelling, fever and tachycardia end-stage
renal disease

EXAM:
MRI OF THE RIGHT FOREFOOT WITHOUT CONTRAST
TECHNIQUE: Multiplanar, multisequence MR imaging of the right was performed. No
intravenous contrast was administered.

[Series 5: T2 fat-sat · coronal · right · 5.0mm · 0.49mm/px · 9 of 42 slices shown (1 of 2)]
[im 1/42]
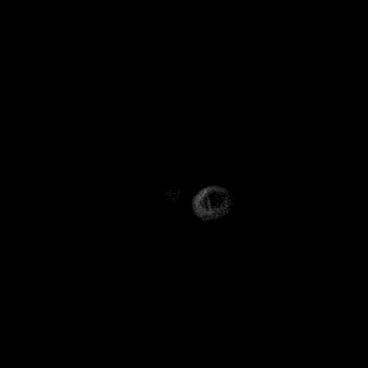
[im 6/42]
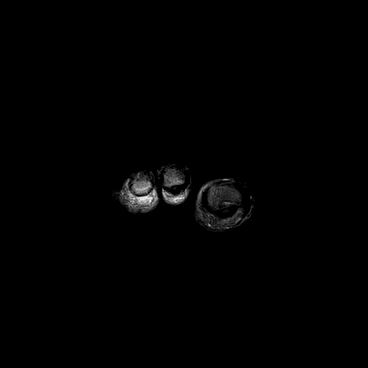
[im 11/42]
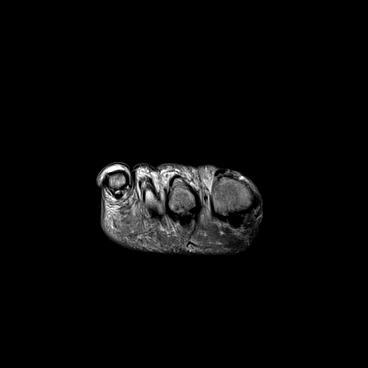
[im 16/42]
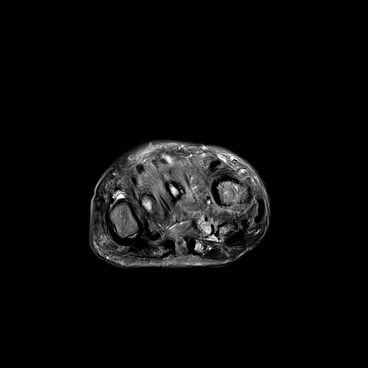
[im 21/42]
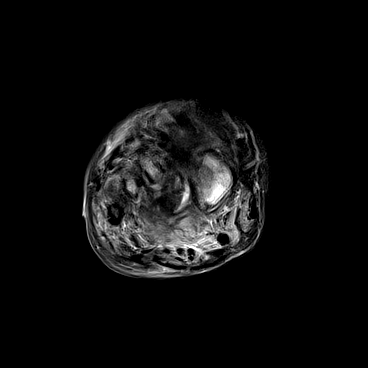
[im 26/42]
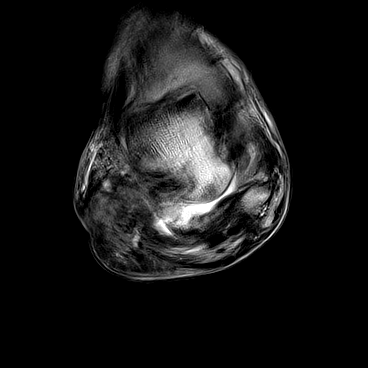
[im 31/42]
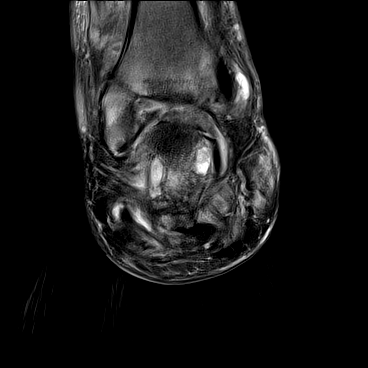
[im 36/42]
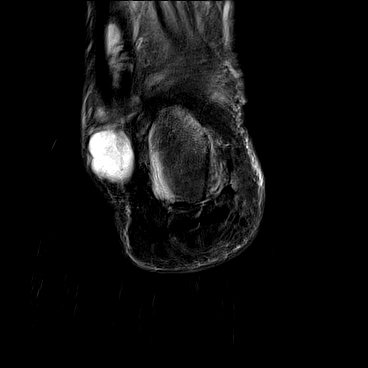
[im 42/42]
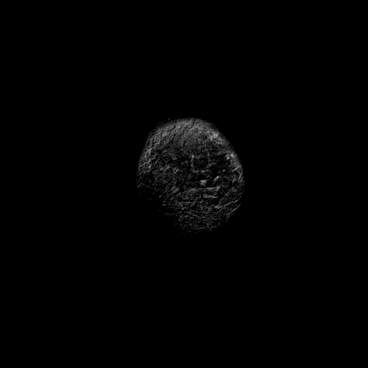

[Series 6: T1 · axial · right · 3.5mm · 0.64mm/px · z∈[-69,+33]mm · 6 of 24 slices shown (1 of 2)]
[im 1/24]
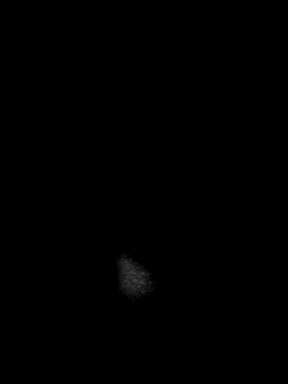
[im 5/24]
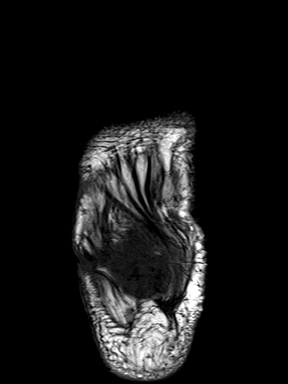
[im 10/24]
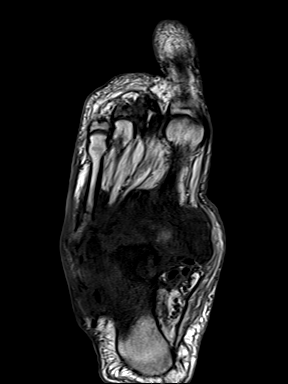
[im 14/24]
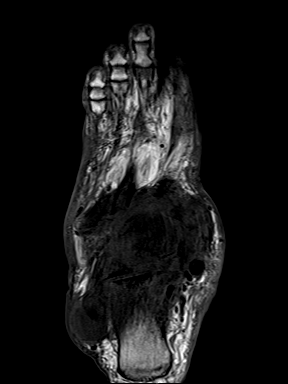
[im 19/24]
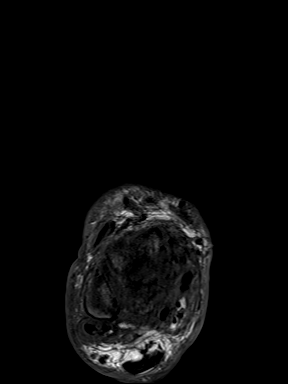
[im 24/24]
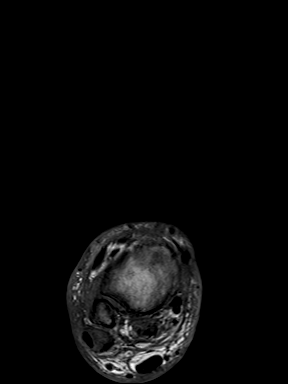

[Series 7: T2 fat-sat · axial · right · 3.5mm · 0.73mm/px · z∈[-71,+31]mm · 6 of 24 slices shown (2 of 2)]
[im 1/24]
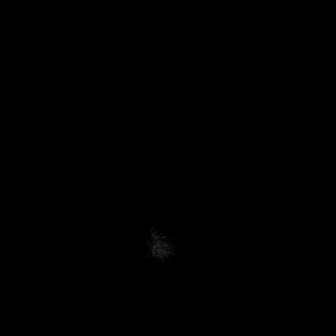
[im 5/24]
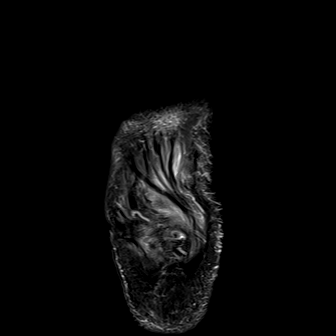
[im 10/24]
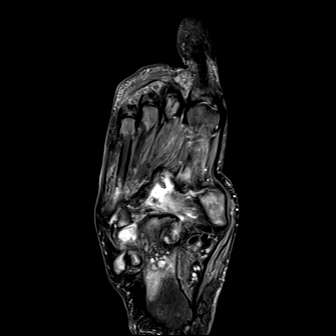
[im 14/24]
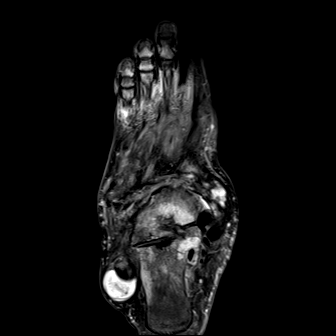
[im 19/24]
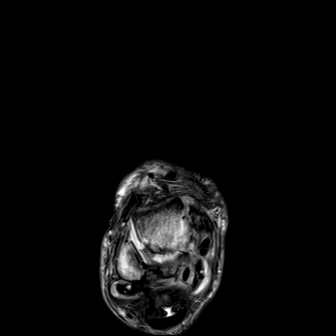
[im 24/24]
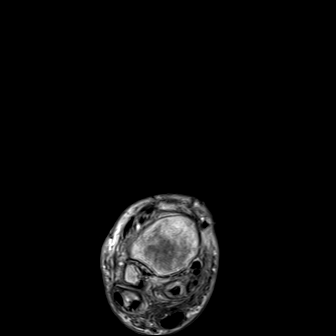

[Series 8: STIR · sagittal · right · 3.0mm · 0.98mm/px · 9 of 38 slices shown]
[im 1/38]
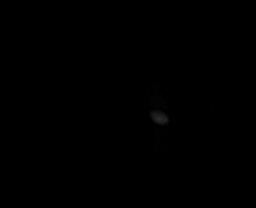
[im 5/38]
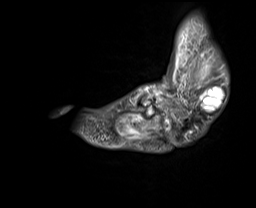
[im 10/38]
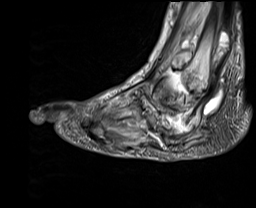
[im 14/38]
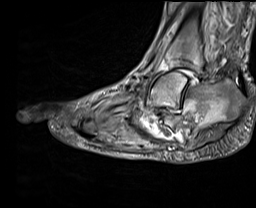
[im 19/38]
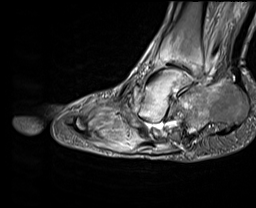
[im 24/38]
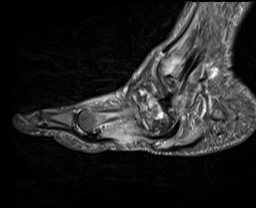
[im 28/38]
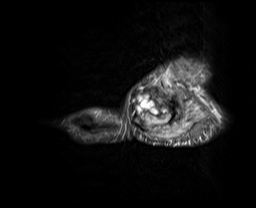
[im 33/38]
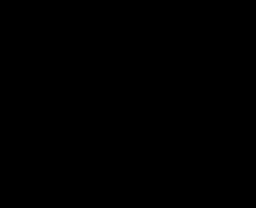
[im 38/38]
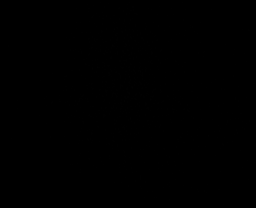

[Series 9: T1 · coronal · right · 5.0mm · 0.47mm/px · 10 of 44 slices shown (2 of 2)]
[im 1/44]
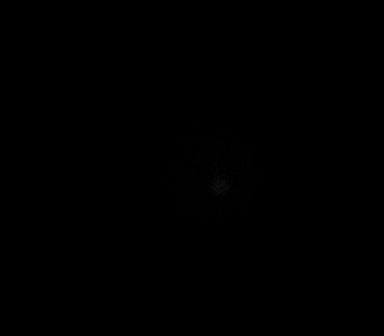
[im 5/44]
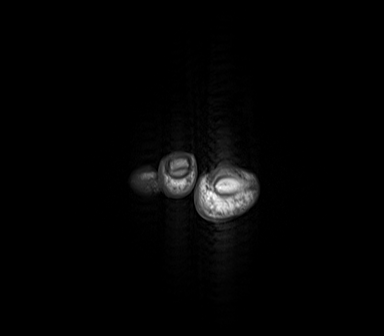
[im 10/44]
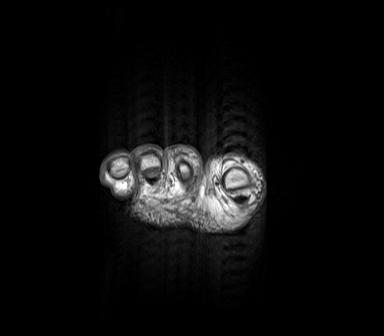
[im 15/44]
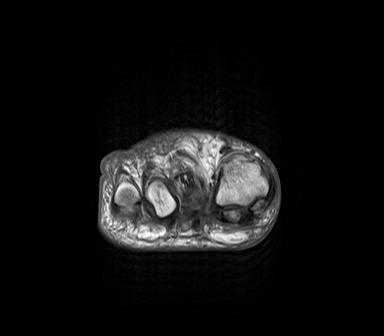
[im 20/44]
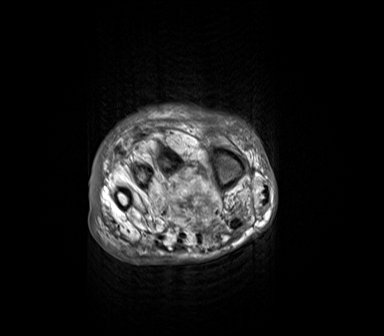
[im 24/44]
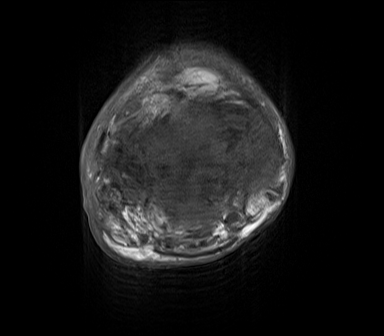
[im 29/44]
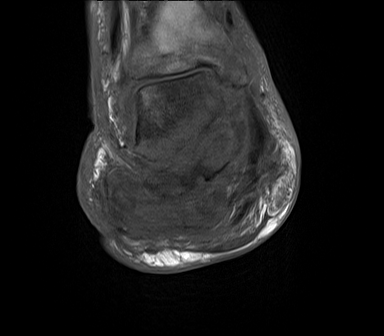
[im 34/44]
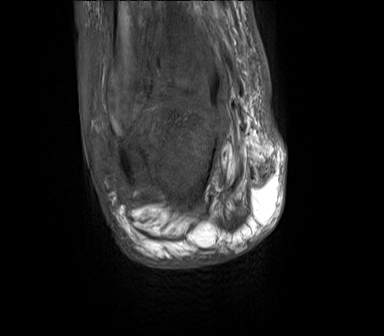
[im 39/44]
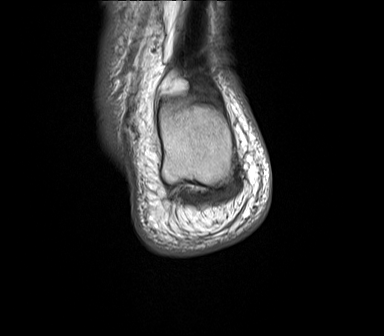
[im 44/44]
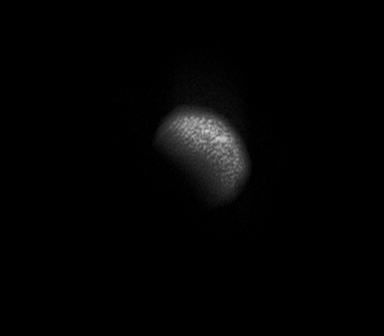

[40 of 40 positions shown; findings below may reference images not displayed]

FINDINGS: Bones/Joint/Cartilage

There is interval progression in the midfoot Charcot arthropathy.
There is fragmentation and destruction of the navicular as well as
the base of the metatarsals diffusely. There is also interval
progression of cortical destruction of the anterior calcaneus with
diffusely increased marrow signal and cystic changes. There is
inferior subluxation of the talus with diffusely increased T2
hyperintense signal. There is diffuse T2 hyperintense signal seen
through the distal tibia and fibula as well. Increased marrow signal
seen through the metatarsals. There is been a prior second toe
amputation. Heterogeneous non loculated fluid seen within the
midfoot.

Ligaments

There is chronic complete disruption of the Lisfranc ligaments.
Suboptimal visualization of the remainder of the ligaments. The
plantar fascia is thickened and intact.

Muscles and Tendons

Diffuse fatty atrophy of the muscles surrounding the foot. There is
heterogeneous fluid surrounding the peroneal tendons. The Achilles
tendon appears to be intact. The distal portions of the flexor
extensor tendons appear to be intact.

Soft tissues

Diffuse dorsal subcutaneous edema seen surrounding the midfoot. No
definite focal ulceration or loculated fluid collection. No sinus
tract is seen.
IMPRESSION: Interval progression in acute destructive Charcot arthropathy of the
midfoot as described above. Diffuse marrow signal changes seen
throughout the midfoot and hindfoot which are likely due to acute
Charcot arthropathy, not thought to be due to acute osteomyelitis
without overlying skin ulceration or sinus tract.

## 2020-12-17 IMAGING — XA IR FLUORO GUIDE CV LINE*R*
1 series · 1 of 1 positions shown · non-contrast
Comparison: none

INDICATION: 63-year-old male with end-stage renal disease on hemodialysis. He
recently had a tunneled hemodialysis catheter removed for bacteremia
and presents for placement of a non tunneled temporary catheter to
allow hemodialysis.

[Series 1: fl(-)  angio sharp · 1 of 1 slices shown]
[im 1/1]
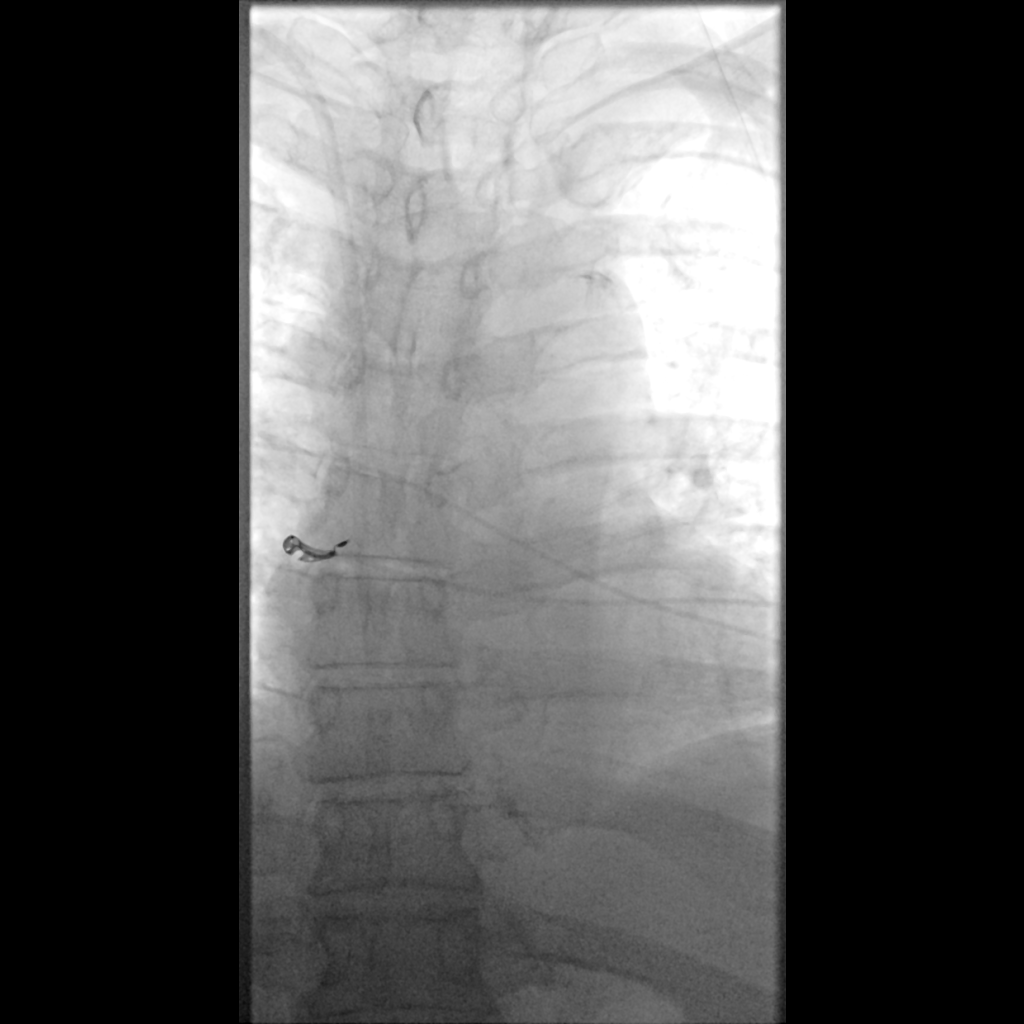

[1 of 1 positions shown; findings below may reference images not displayed]

EXAM:
IR RIGHT FLUORO GUIDE CV LINE; IR ULTRASOUND GUIDANCE VASC ACCESS
RIGHT

MEDICATIONS:
None.

ANESTHESIA/SEDATION:
None.

FLUOROSCOPY TIME:  Fluoroscopy Time: 2 minutes 30 seconds (9 mGy).

COMPLICATIONS:
None immediate.

PROCEDURE:
Informed written consent was obtained from the patient after a
thorough discussion of the procedural risks, benefits and
alternatives. All questions were addressed. Maximal Sterile Barrier
Technique was utilized including caps, mask, sterile gowns, sterile
gloves, sterile drape, hand hygiene and skin antiseptic. A timeout
was performed prior to the initiation of the procedure.

The right internal jugular vein was interrogated with ultrasound and
found to be widely patent. An image was obtained and stored for the
medical record. Local anesthesia was attained by infiltration with
1% lidocaine. A small dermatotomy was made. Under real-time
sonographic guidance, the vessel was punctured with a 21 gauge
micropuncture needle. Using standard technique, the initial micro
needle was exchanged over a 0.018 micro wire for a transitional 4
French micro sheath. The micro sheath was then exchanged over a
0.035 wire for a fascial dilator. The soft tissue tract was then
dilated. A 20 cm triple-lumen temporary hemodialysis catheter was
then advanced over the wire and positioned with the catheter tip in
the upper right atrium. Catheter flushes and aspirates easily. The
catheter was flushed with heparinized saline, capped and secured to
the skin with 0 Prolene suture. A sterile bandage was applied.
IMPRESSION: Successful placement of a 20 cm non tunneled hemodialysis catheter
via the right internal jugular vein. The catheter tip is in the
upper right atrium and the catheter is ready for immediate use.
